# Patient Record
Sex: Female | Born: 1965 | Race: Asian | Hispanic: No | Marital: Married | Smoking: Never smoker
Health system: Southern US, Community
[De-identification: ages and names within clinical notes are randomized; demographics above are authoritative.]

## PROBLEM LIST (undated history)

## (undated) DIAGNOSIS — E039 Hypothyroidism, unspecified: Secondary | ICD-10-CM

## (undated) DIAGNOSIS — E079 Disorder of thyroid, unspecified: Secondary | ICD-10-CM

## (undated) DIAGNOSIS — Z8632 Personal history of gestational diabetes: Secondary | ICD-10-CM

## (undated) DIAGNOSIS — Z8719 Personal history of other diseases of the digestive system: Secondary | ICD-10-CM

## (undated) DIAGNOSIS — D649 Anemia, unspecified: Secondary | ICD-10-CM

## (undated) HISTORY — DX: Personal history of gestational diabetes: Z86.32

## (undated) HISTORY — DX: Personal history of other diseases of the digestive system: Z87.19

## (undated) HISTORY — PX: TUBAL LIGATION: SHX77

## (undated) HISTORY — PX: LAPAROTOMY: SHX154

## (undated) HISTORY — DX: Disorder of thyroid, unspecified: E07.9

---
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
## 1994-12-22 DIAGNOSIS — Z8632 Personal history of gestational diabetes: Secondary | ICD-10-CM

## 1994-12-22 HISTORY — DX: Personal history of gestational diabetes: Z86.32

## 2008-05-16 ENCOUNTER — Encounter: Admission: RE | Admit: 2008-05-16 | Discharge: 2008-05-16 | Payer: Self-pay | Admitting: Internal Medicine

## 2008-06-05 ENCOUNTER — Encounter: Admission: RE | Admit: 2008-06-05 | Discharge: 2008-06-05 | Payer: Self-pay | Admitting: Internal Medicine

## 2008-07-27 ENCOUNTER — Other Ambulatory Visit: Admission: RE | Admit: 2008-07-27 | Discharge: 2008-07-27 | Payer: Self-pay | Admitting: Obstetrics & Gynecology

## 2011-11-17 ENCOUNTER — Other Ambulatory Visit (HOSPITAL_COMMUNITY): Payer: Self-pay | Admitting: Internal Medicine

## 2011-11-17 DIAGNOSIS — E049 Nontoxic goiter, unspecified: Secondary | ICD-10-CM

## 2011-11-20 ENCOUNTER — Ambulatory Visit (HOSPITAL_COMMUNITY)
Admission: RE | Admit: 2011-11-20 | Discharge: 2011-11-20 | Disposition: A | Discharge status: Home / Self Care | Payer: Self-pay | Source: Ambulatory Visit | Attending: Internal Medicine | Admitting: Internal Medicine

## 2011-11-20 DIAGNOSIS — E042 Nontoxic multinodular goiter: Secondary | ICD-10-CM | POA: Insufficient documentation

## 2011-11-20 DIAGNOSIS — E049 Nontoxic goiter, unspecified: Secondary | ICD-10-CM

## 2013-06-27 ENCOUNTER — Other Ambulatory Visit: Payer: Self-pay | Admitting: Internal Medicine

## 2013-06-27 DIAGNOSIS — E049 Nontoxic goiter, unspecified: Secondary | ICD-10-CM

## 2013-06-29 ENCOUNTER — Other Ambulatory Visit: Payer: Self-pay | Admitting: Internal Medicine

## 2013-06-29 ENCOUNTER — Other Ambulatory Visit: Payer: Self-pay

## 2013-06-29 ENCOUNTER — Ambulatory Visit
Admission: RE | Admit: 2013-06-29 | Discharge: 2013-06-29 | Disposition: A | Discharge status: Home / Self Care | Payer: BC Managed Care – PPO | Source: Ambulatory Visit | Attending: Internal Medicine | Admitting: Internal Medicine

## 2013-06-29 DIAGNOSIS — E049 Nontoxic goiter, unspecified: Secondary | ICD-10-CM

## 2013-06-30 ENCOUNTER — Other Ambulatory Visit: Payer: Self-pay | Admitting: Internal Medicine

## 2013-06-30 DIAGNOSIS — E041 Nontoxic single thyroid nodule: Secondary | ICD-10-CM

## 2013-07-06 ENCOUNTER — Ambulatory Visit
Admission: RE | Admit: 2013-07-06 | Discharge: 2013-07-06 | Disposition: A | Discharge status: Home / Self Care | Payer: BC Managed Care – PPO | Source: Ambulatory Visit | Attending: Internal Medicine | Admitting: Internal Medicine

## 2013-07-06 ENCOUNTER — Other Ambulatory Visit (HOSPITAL_COMMUNITY)
Admission: RE | Admit: 2013-07-06 | Discharge: 2013-07-06 | Disposition: A | Discharge status: Home / Self Care | Payer: BC Managed Care – PPO | Source: Ambulatory Visit | Attending: Interventional Radiology | Admitting: Interventional Radiology

## 2013-07-06 DIAGNOSIS — E041 Nontoxic single thyroid nodule: Secondary | ICD-10-CM

## 2013-07-06 DIAGNOSIS — D449 Neoplasm of uncertain behavior of unspecified endocrine gland: Secondary | ICD-10-CM | POA: Insufficient documentation

## 2013-11-21 ENCOUNTER — Ambulatory Visit (INDEPENDENT_AMBULATORY_CARE_PROVIDER_SITE_OTHER): Payer: BC Managed Care – PPO | Admitting: General Surgery

## 2013-11-21 ENCOUNTER — Encounter (INDEPENDENT_AMBULATORY_CARE_PROVIDER_SITE_OTHER): Payer: Self-pay | Admitting: General Surgery

## 2013-11-21 ENCOUNTER — Encounter (INDEPENDENT_AMBULATORY_CARE_PROVIDER_SITE_OTHER): Payer: Self-pay

## 2013-11-21 VITALS — BP 100/68 | HR 71 | Temp 98.5°F | Resp 16 | Ht 67.0 in | Wt 143.0 lb

## 2013-11-21 DIAGNOSIS — E041 Nontoxic single thyroid nodule: Secondary | ICD-10-CM

## 2013-11-21 DIAGNOSIS — E042 Nontoxic multinodular goiter: Secondary | ICD-10-CM | POA: Insufficient documentation

## 2013-11-21 NOTE — Progress Notes (Signed)
Patient ID: Kristen Mendez, female   DOB: [birth year], 47 y.o.   MRN: [id number]  Chief Complaint  Patient presents with  . Thyroid Nodule    HPI Kristen Mendez is a 47 y.o. female.  She is referred by Dr. Evette Doffing for evaluation of mild goiter and multiple thyroid nodules.  The patient states that her thyroid gland felt big in 1996. More recently she feels tight in the neck. It feels tight when she swallows but the food goes right on down. She says her voice is "tired" at the end of the day, but no real hoarseness. The thyroid has never been painful or tender to touch.  Ultrasound on 06/29/2013 shows  enlarged bilateral thyroid lobes, heterogeneous, hypervascular. There is a 23 mm nodule on the left side that has been enlarging. There are 2 other nodules in the left side. On the right side there is a subcentimeter nodule. Fine needle aspiration cytology of the largest nodule on the left shows lymphocytes and Hurthle cells and is felt to be consistent with thyroiditis. This was signed out as a follicular lesion of uncertain significance.  TSH level on 06/21/2013 was 2.6 which is normal. She was thereafter placed on Synthroid but there was no change in her thyroid gland she did not suppress. Her TSH level on 10/28/2039 was 2.04.She was found to have iron deficiency anemia with a hemoglobin of 9.3 in July of 10.4 in November. She is been placed on iron.  Dr. Ricki Miller discussed this with Dr. Juleen China (endocrinology)  who suggested that with the size she should consider removal.  Family history is negative for thyroid cancer, multiple endocrine neoplasias.  Patient's past history is negative for radiation therapy. She a laparotomy for "pancreatitis" in [place]. She states that nothing was removed and this was a diagnostic procedure.  HPI  Past Medical History  Diagnosis Date  . Thyroid disease   . Hx of acute pancreatitis 1981  . Hx gestational diabetes 1996    Past Surgical History  Procedure  Laterality Date  . Cesarean section  1996    Family History  Problem Relation Age of Onset  . Diabetes Father     Social History History  Substance Use Topics  . Smoking status: Never Smoker   . Smokeless tobacco: Not on file  . Alcohol Use: No    No Known Allergies  Current Outpatient Prescriptions  Medication Sig Dispense Refill  . levothyroxine (SYNTHROID, LEVOTHROID) 75 MCG tablet Take 75 mcg by mouth daily before breakfast.      . Multiple Vitamin (MULTIVITAMIN) tablet Take 1 tablet by mouth daily.       No current facility-administered medications for this visit.    Review of Systems Review of Systems  Constitutional: Negative for fever, chills and unexpected weight change.  HENT: Positive for trouble swallowing and voice change. Negative for congestion, hearing loss and sore throat.   Eyes: Negative for visual disturbance.  Respiratory: Negative for cough and wheezing.   Cardiovascular: Negative for chest pain, palpitations and leg swelling.  Gastrointestinal: Negative for nausea, vomiting, abdominal pain, diarrhea, constipation, blood in stool, abdominal distention and anal bleeding.  Genitourinary: Negative for hematuria, vaginal bleeding and difficulty urinating.  Musculoskeletal: Negative for arthralgias.  Skin: Negative for rash and wound.  Neurological: Negative for seizures, syncope and headaches.  Hematological: Negative for adenopathy. Does not bruise/bleed easily.  Psychiatric/Behavioral: Negative for confusion. The patient is nervous/anxious.     Blood pressure 100/68, pulse 71, temperature 98.5  F (36.9 C), temperature source Temporal, resp. rate 16, height 5\' 7"  (1.702 m), weight 143 lb (64.864 kg).  Physical Exam Physical Exam  Constitutional: She is oriented to person, place, and time. She appears well-developed and well-nourished. She appears distressed.  She has mild anxiety and is a little bit distressed in making decisions.  HENT:  Head:  Normocephalic and atraumatic.  Nose: Nose normal.  Mouth/Throat: No oropharyngeal exudate.  Eyes: Conjunctivae and EOM are normal. Pupils are equal, round, and reactive to light. Left eye exhibits no discharge. No scleral icterus.  Neck: Neck supple. No JVD present. No tracheal deviation present. Thyromegaly present.  Mildly prominent  bilateral thyroid lobes. Nontender. No discrete mass. No tracheal deviation. Voice is normal.  Cardiovascular: Normal rate, regular rhythm, normal heart sounds and intact distal pulses.   No murmur heard. Pulmonary/Chest: Effort normal and breath sounds normal. No respiratory distress. She has no wheezes. She has no rales. She exhibits no tenderness.  Abdominal: Soft. Bowel sounds are normal. She exhibits no distension and no mass. There is no tenderness. There is no rebound and no guarding.  Musculoskeletal: She exhibits no edema and no tenderness.  Lymphadenopathy:    She has no cervical adenopathy.  Neurological: She is alert and oriented to person, place, and time. She exhibits normal muscle tone. Coordination normal.  Skin: Skin is warm. No rash noted. She is not diaphoretic. No erythema. No pallor.  Psychiatric: She has a normal mood and affect. Her behavior is normal. Judgment and thought content normal.  Some anxiety and ambivalence about decision-making, but otherwise mental status is completely normal.    Data Reviewed Office notes. Imaging studies. Cytopathology report. 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  Hashimoto's thyroiditis is also a possibility. The patient was advised of this and that it may lead to hypothyroidism in the future.  Enlarging left thyroid nodule. Left thyroid lobectomy is offered to rule out neoplasia  Pressure symptoms are vague. Her goiter may or may not be causing her swallowing problems. She was advised that left thyroid lobectomy  may or may not improve this. No apparent indication for  total thyroidectomy based on workup to date.  Iron deficiency anemia  Remote history of exploratory laparotomy with findings of pancreatitis in [place].  Mild anxiety     Plan    I had a very long talk with the patient about the differential diagnosis. Indications for surgery. Long-term followup. Recommend extent of surgery. Expectations of surgery. I told her I was not sure that her swallowing symptoms would improve. I told her that the main indication for surgery was an enlarging nodule greater than 2 cm, and that left thyroid lobectomy was indicated to rule out neoplasia. She is aware that this is elective.  At the end of a very long conversation, she indicated that she would like to go ahead with left thyroid lobectomy. She is aware that if well-differentiated cancer is found that she may need a completion thyroidectomy at a later date.  She'll be scheduled for left thyroid lobectomy with overnight stay in the hospital.  I discussed the indications, details, techniques, and numerous risk of the surgery with her. She is aware of the risk of bleeding, infection, reoperation for cancer. She's aware of the risk of temporary or permanent hypocalcemia from parathyroid gland injury, although less likely with a lobectomy than a total thyroidectomy. She is aware of the possibility of temporary or permanent hoarseness from  recurrent laryngeal  nerve injury. She knows that she may or may not require thyroid hormone in the future. At this time all of her questions are answered and she seems to understand all these issues well. She agrees with this plan.        Angelia Mould. Derrell Lolling, M.D., [facility] General and Minimally invasive Surgery Breast and Colorectal Surgery Office:   [phone] Pager:   [phone]  11/21/2013, 5:33 PM

## 2013-11-21 NOTE — Patient Instructions (Signed)
You have enlarging nodules in your left thyroid lobe. Dr. Derrell Lolling advises a left thyroid lobectomy to prove that these are not low-grade cancers.  The tightness that you feel in your throat may or may not be due to your thyroid gland. We will have to wait and see.     Thyroidectomy Thyroidectomy is the removal of part or all of your thyroid gland. Your thyroid gland is a butterfly-shaped gland at the base of your neck. It produces a substance called thyroid hormone, which regulates the physical and chemical processes that keep your body functioning and make energy available to your body (metabolism). The amount of thyroid gland tissue that is removed during a thyroidectomy depends on the reason for the procedure. Typically, if only a part of your gland is removed, enough thyroid gland tissue remains to maintain normal function. If your entire thyroid gland is removed or if the amount of thyroid gland tissue remaining is inadequate to maintain normal function, you will need life-long treatment with thyroid hormone on a daily basis. Thyroidectomy maybe performed when you have the following conditions:  Thyroid nodules. These are small, abnormal collections of tissue that form inside the thyroid gland. If these nodules begin to enlarge at a rapid rate, a sample of tissue from the nodule is taken through a needle and examined (needle biopsy). This is done to determine if the nodules are cancerous. Depending on the outcome of this exam, thyroidectomy may be necessary.  Thyroid cancer.  Goiter, which is an enlarged thyroid gland. All or part of the thyroid gland may be removed if the gland has become so large that it causes difficulty breathing or swallowing.  Hyperthyroidism. This is when the thyroid gland produces too much thyroid hormone. Hypothyroidism can cause symptoms of fluctuating weight, intolerance to heat, irritability, shortness of breath, and chest pain. LET YOUR CAREGIVER KNOW ABOUT:    Allergies to food or medicine.  Medicines that you are taking, including vitamins, herbs, eyedrops, over-the-counter medicines, and creams.  Previous problems you have had with anesthetics or numbing medicines.  History of bleeding problems or blood clots.  Previous surgeries you have had.  Other health problems, including diabetes and kidney problems, you have had.  Possibility of pregnancy, if this applies. BEFORE THE PROCEDURE   Do not eat or drink anything, including water, for at least 6 hours before the procedure.  Ask your caregiver whether you should stop taking certain medicines before the day of the procedure. PROCEDURE  There are different ways that thyroidectomy is performed. For each type, you will be given a medicine to make you sleep (general anesthetic). The three main types of thyroidectomy are listed as follows:  Conventional thyroidectomy A cut (incision) in the center portion of your lower neck is made with a scalpel. Muscles below your skin are separated to gain access to your thyroid gland. Your thyroid gland is dissected from your windpipe (trachea). Often a drain is placed at the incision site to drain any blood that accumulates under the skin after the procedure. This drain will be removed before you go home. The wound from the incision should heal within 2 weeks.  Endoscopic thyroidectomy Small incisions are made in your lower neck. A small instrument (endoscope) is inserted under your skin at the incision sites. The endoscope used for thyroidectomy consists of 2 flexible tubes. Inside one of the tubes is a video camera that is used to guide the Careers adviser. Tools to remove the thyroid gland, including a tool  to cut the gland (dissectors) and a suction device, are inserted through the other tube. The surgeon uses the dissectors to dissect the thyroid gland from the trachea and remove it.  Robotic thyroidectomy This procedure allows your thyroid gland to be removed  through incisions in your armpit, your chest, or high in your neck. Instruments similar to endoscopes provide a 3-dimensional picture of the surgical site. Dissecting instruments are controlled by devices similar to joysticks. These devices allow more accurate manipulation of the instruments. After the blood supply to the gland is removed, the gland is cut into several pieces and removed through the incisions. RISKS AND COMPLICATIONS Complications associated with thyroidectomy are rare, but they can occur. Possible complications include:  A decrease in parathyroid hormone levels (hypoparathyroidism) Your parathyroid glands are located close behind your thyroid gland. They are responsible for maintaining calcium levels inthe body. If they are damaged or removed, levels of calcium in the blood become low and nerves become irritable, which can cause muscle spasms. Medicines are available to treat this.  Bacterial infection This can often be treated with medicines that kill bacteria (antibiotics).  Damage to your voice box nerves This could cause hoarseness or complete loss of voice.  Bleeding or airway obstruction. AFTER THE PROCEDURE   You will rest in the recovery room as you wake up.  When you first wake up, your throat may feel slightly sore.  You will not be allowed to eat or drink until instructed otherwise.  You will be taken to your hospital room. You will usually stay at the hospital for 1 or 2 nights.  If a drain is placed during the procedure, it usually is removed the next day.  You may have some mild neck pain.  Your voice may be weak. This usually is temporary. Document Released: 06/03/2001 Document Revised: 04/04/2013 Document Reviewed: 03/12/2011 [facility] Patient Information 2014 [place], [place].

## 2013-12-22 HISTORY — PX: OTHER SURGICAL HISTORY: SHX169

## 2013-12-27 ENCOUNTER — Other Ambulatory Visit: Payer: Self-pay | Admitting: Internal Medicine

## 2013-12-27 DIAGNOSIS — E079 Disorder of thyroid, unspecified: Secondary | ICD-10-CM

## 2014-02-08 ENCOUNTER — Other Ambulatory Visit: Payer: BC Managed Care – PPO

## 2014-04-23 IMAGING — US US THYROID BIOPSY
1 series · 13 of 15 positions shown · non-contrast
Comparison: none

INDICATION: Indeterminate left-sided thyroid nodule

[Series 1: us thyroid biopsy · 0.06mm/px · 15 acquisitions, 13 frames shown]
[im 1/15]
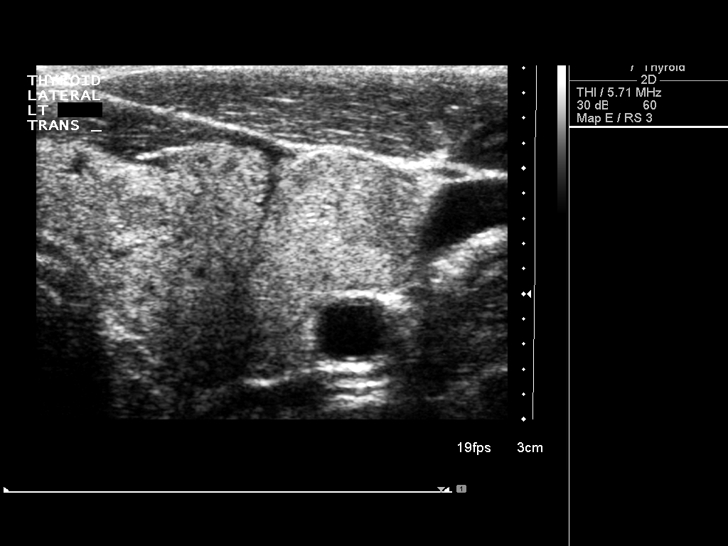
[im 2/15]
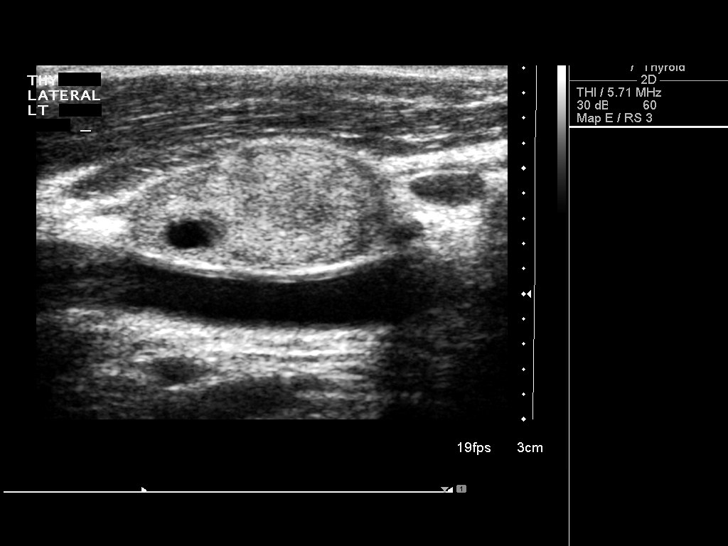
[im 3/15]
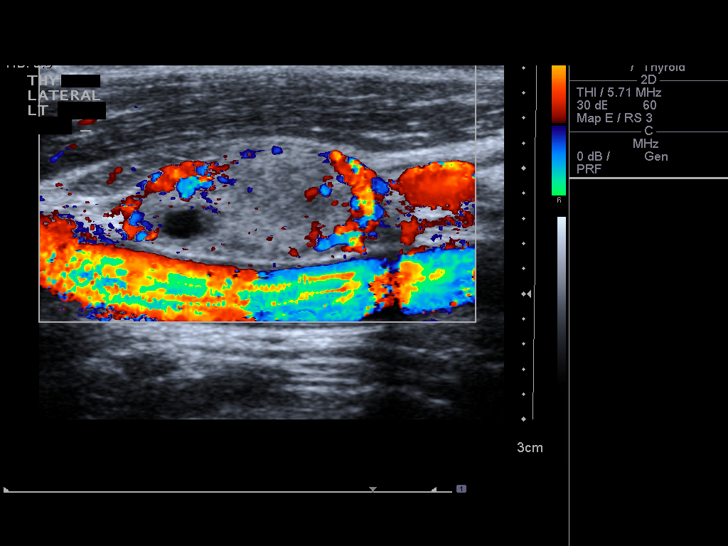
[im 5/15]
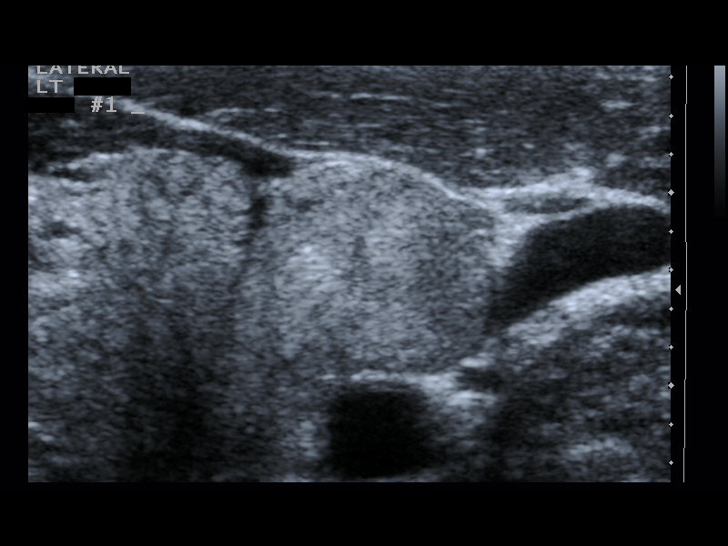
[im 6/15]
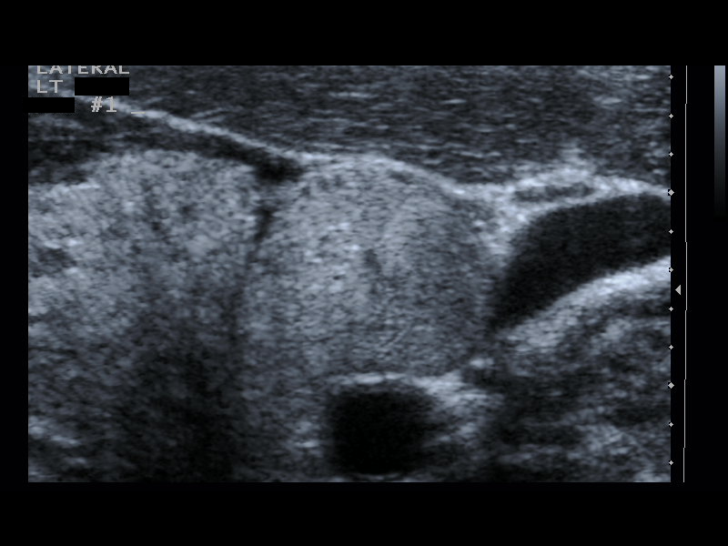
[im 7/15]
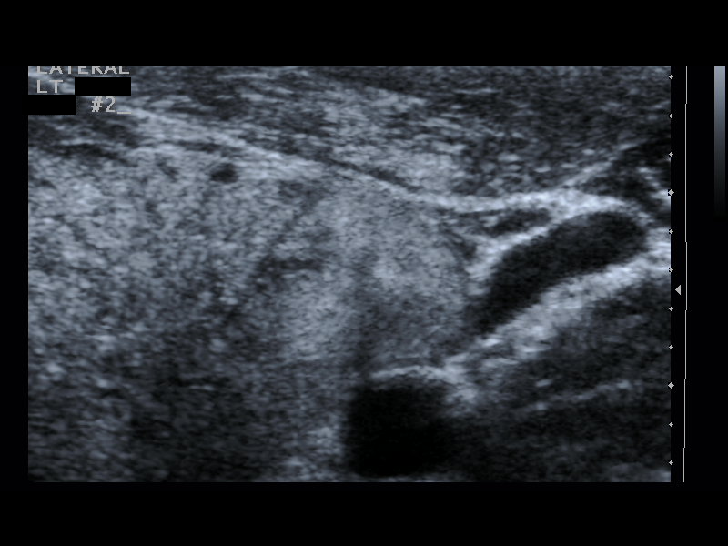
[im 8/15]
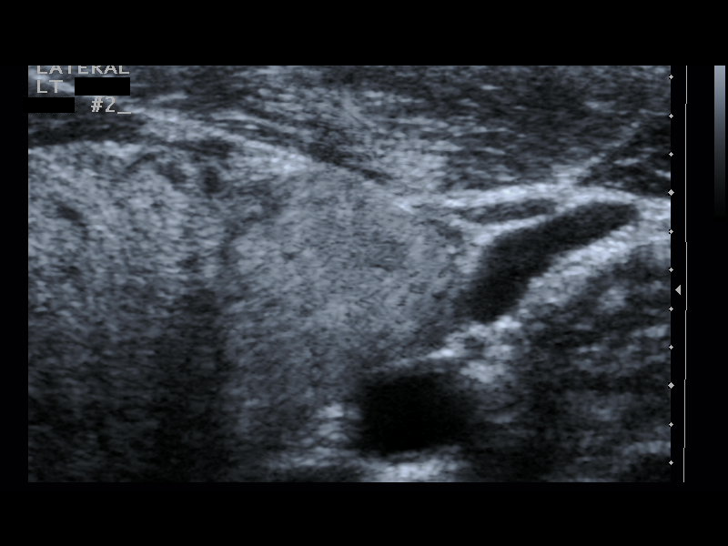
[im 9/15]
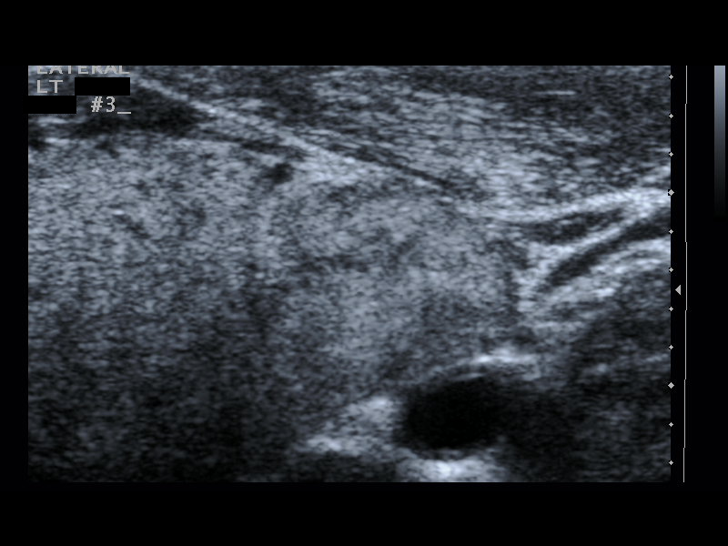
[im 10/15]
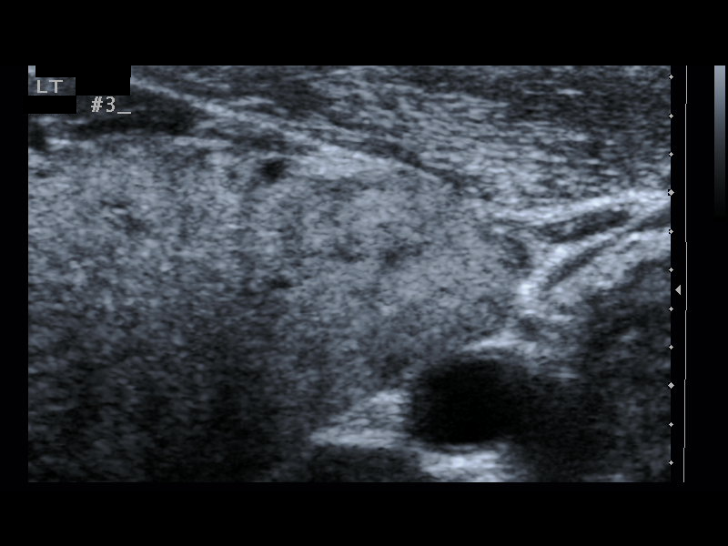
[im 11/15]
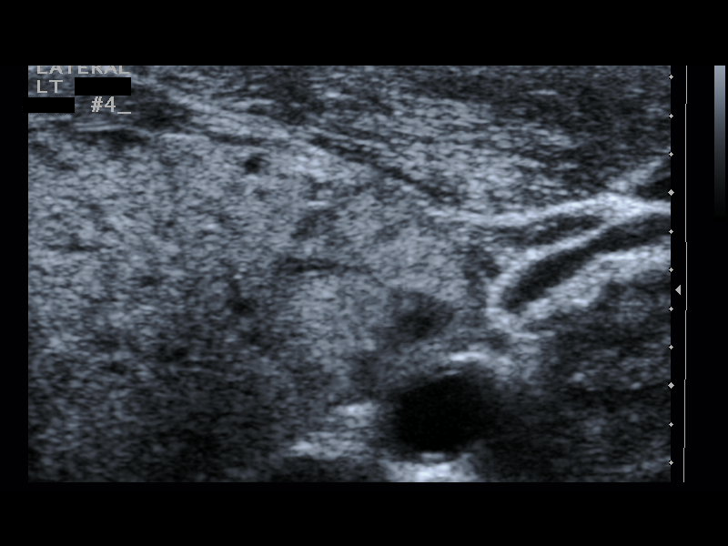
[im 13/15]
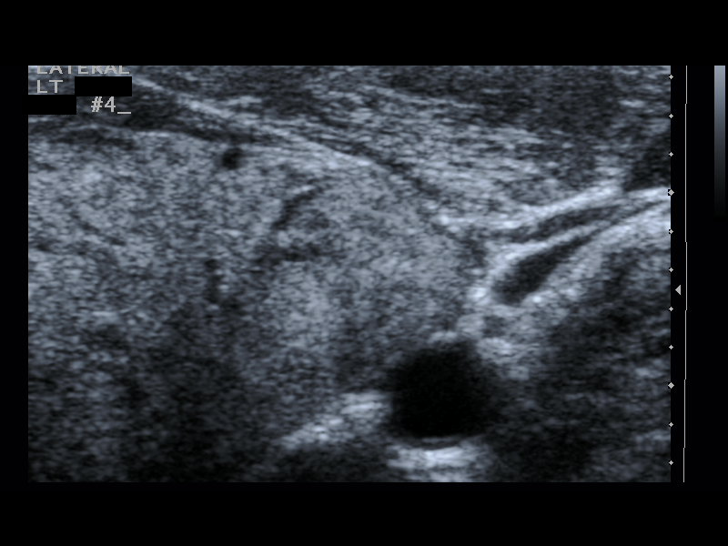
[im 14/15]
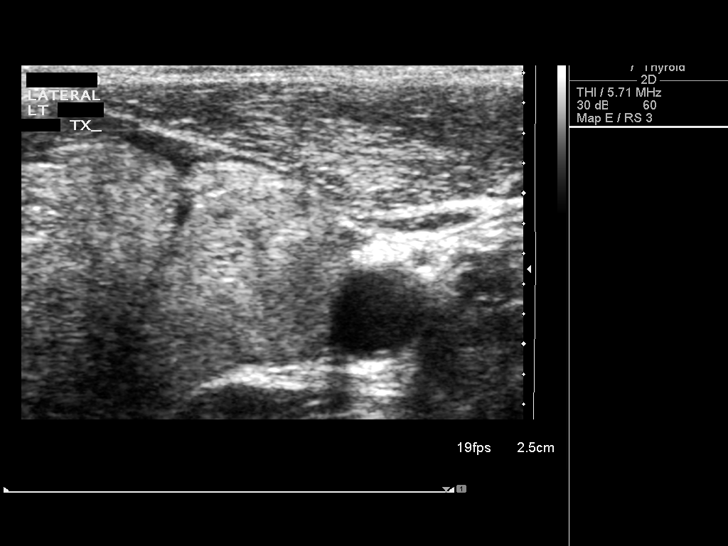
[im 15/15]
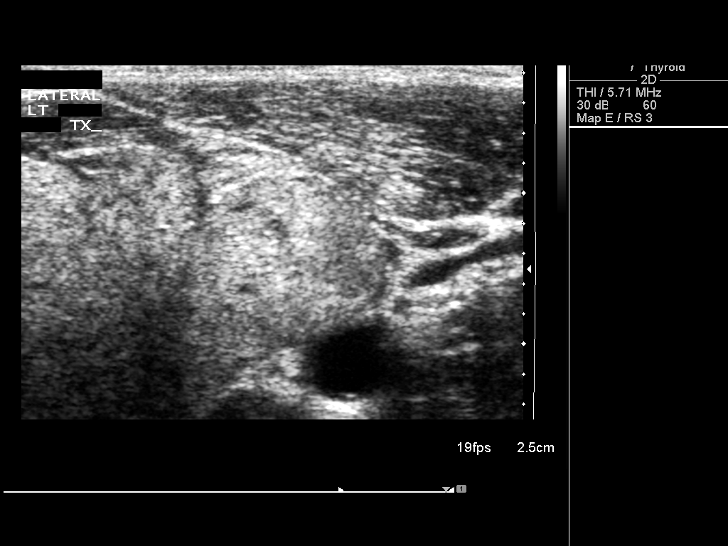

[13 of 15 positions shown; findings below may reference images not displayed]

ULTRASOUND GUIDED THYROID FINE NEEDLE ASPIRATION

Comparisons: Thyroid Ultrasound - 06/29/2013; 11/20/2011

Intravenous Medications: None

Complications: None immediate

Technique / Findings:

Informed written consent was obtained from the patient after a
discussion of the risks, benefits and alternatives to treatment.
Questions regarding the procedure were encouraged and answered.  A
timeout was performed prior to the initiation of the procedure.

Pre-procedural ultrasound scanning demonstrated grossly unchanged
appearance of mixed echogenic solid nodule within the lateral
inferior aspect of the left lobe of the thyroid..  The procedure
was planned.  The neck was prepped in the usual sterile fashion,
and a sterile drape was applied covering the operative field.  A
timeout was performed prior to the initiation of the procedure.
Local anesthesia was provided with 1% lidocaine.

Under direct ultrasound guidance, 4 FNA biopsies were performed of
nodule with a 25 gauge needle.  The samples were prepared and
submitted to pathology.  Limited post procedural scanning was
negative for hematoma or additional complication.  A dressing was
placed.  The patient tolerated procedure well without immediate
postprocedural complication.
IMPRESSION: Technically successful ultrasound guided fine needle aspiration of
dominant solid nodule within the inferior lateral aspect of the
left lobe of the thyroid.

## 2014-06-12 ENCOUNTER — Other Ambulatory Visit (INDEPENDENT_AMBULATORY_CARE_PROVIDER_SITE_OTHER): Payer: Self-pay | Admitting: Otolaryngology

## 2014-06-12 DIAGNOSIS — R198 Other specified symptoms and signs involving the digestive system and abdomen: Secondary | ICD-10-CM

## 2014-06-12 DIAGNOSIS — R0989 Other specified symptoms and signs involving the circulatory and respiratory systems: Secondary | ICD-10-CM

## 2014-06-26 ENCOUNTER — Ambulatory Visit
Admission: RE | Admit: 2014-06-26 | Discharge: 2014-06-26 | Disposition: A | Discharge status: Home / Self Care | Payer: BC Managed Care – PPO | Source: Ambulatory Visit | Attending: Otolaryngology | Admitting: Otolaryngology

## 2014-06-26 DIAGNOSIS — R198 Other specified symptoms and signs involving the digestive system and abdomen: Secondary | ICD-10-CM

## 2014-06-26 DIAGNOSIS — R0989 Other specified symptoms and signs involving the circulatory and respiratory systems: Secondary | ICD-10-CM

## 2014-06-26 MED ORDER — IOHEXOL 300 MG/ML  SOLN
75.0000 mL | Freq: Once | INTRAMUSCULAR | Status: AC | PRN
  Administered 2014-06-26: 75 mL via INTRAVENOUS

## 2015-01-03 ENCOUNTER — Telehealth: Payer: Self-pay | Admitting: Obstetrics and Gynecology

## 2015-01-03 ENCOUNTER — Encounter: Payer: Self-pay | Admitting: Obstetrics and Gynecology

## 2015-01-03 NOTE — Telephone Encounter (Signed)
.   OPEN BY ERROR

## 2015-01-10 ENCOUNTER — Other Ambulatory Visit: Payer: Self-pay | Admitting: Obstetrics & Gynecology

## 2015-01-11 LAB — CYTOLOGY - PAP

## 2015-06-11 ENCOUNTER — Other Ambulatory Visit: Payer: Self-pay | Admitting: Obstetrics & Gynecology

## 2015-06-18 ENCOUNTER — Other Ambulatory Visit: Payer: Self-pay | Admitting: Obstetrics & Gynecology

## 2015-06-19 NOTE — Patient Instructions (Addendum)
   Your procedure is scheduled on:  Monday, July 11  Enter through the Main Entrance of [facility] at: 10:45 AM Pick up the phone at the desk and dial [phone] and inform us of your arrival.  Please call this number if you have any problems the morning of surgery: [phone]  Remember: Do not eat food after midnight: Sunday Do not drink clear liquids after: 8 AM Monday, day of surgery Take these medicines the morning of surgery with a SIP OF WATER:  synthroid  Do not wear jewelry, make-up, or FINGER nail polish No metal in your hair or on your body. Do not wear lotions, powders, perfumes.  You may wear deodorant.  Do not bring valuables to the hospital. Contacts may not be worn into surgery.  Leave suitcase in the car. After Surgery it may be brought to your room. For patients being admitted to the hospital, checkout time is 11:00am the day of discharge.  Home with Kristen Mendez cell [phone]

## 2015-06-20 ENCOUNTER — Encounter (HOSPITAL_COMMUNITY)
Admission: RE | Admit: 2015-06-20 | Discharge: 2015-06-20 | Disposition: A | Discharge status: Home / Self Care | Payer: 59 | Source: Ambulatory Visit | Attending: Obstetrics & Gynecology | Admitting: Obstetrics & Gynecology

## 2015-06-20 ENCOUNTER — Encounter (HOSPITAL_COMMUNITY): Payer: Self-pay

## 2015-06-20 DIAGNOSIS — Z01818 Encounter for other preprocedural examination: Secondary | ICD-10-CM | POA: Insufficient documentation

## 2015-06-20 DIAGNOSIS — D259 Leiomyoma of uterus, unspecified: Secondary | ICD-10-CM | POA: Insufficient documentation

## 2015-06-20 DIAGNOSIS — N920 Excessive and frequent menstruation with regular cycle: Secondary | ICD-10-CM | POA: Diagnosis not present

## 2015-06-20 HISTORY — DX: Hypothyroidism, unspecified: E03.9

## 2015-06-20 HISTORY — DX: Anemia, unspecified: D64.9

## 2015-06-20 LAB — CBC
HCT: 35 % — ABNORMAL LOW (ref 36.0–46.0)
Hemoglobin: 11.4 g/dL — ABNORMAL LOW (ref 12.0–15.0)
MCH: 26.5 pg (ref 26.0–34.0)
MCHC: 32.6 g/dL (ref 30.0–36.0)
MCV: 81.2 fL (ref 78.0–100.0)
PLATELETS: 195 10*3/uL (ref 150–400)
RBC: 4.31 MIL/uL (ref 3.87–5.11)
RDW: 16.7 % — ABNORMAL HIGH (ref 11.5–15.5)
WBC: 4.9 10*3/uL (ref 4.0–10.5)

## 2015-06-20 LAB — TYPE AND SCREEN
ABO/RH(D): B POS
Antibody Screen: NEGATIVE

## 2015-06-21 LAB — ABO/RH: ABO/RH(D): B POS

## 2015-07-02 ENCOUNTER — Observation Stay (HOSPITAL_COMMUNITY)
Admission: RE | Admit: 2015-07-02 | Discharge: 2015-07-03 | Disposition: A | Discharge status: Home / Self Care | Payer: 59 | Source: Ambulatory Visit | Attending: Obstetrics & Gynecology | Admitting: Obstetrics & Gynecology

## 2015-07-02 ENCOUNTER — Encounter (HOSPITAL_COMMUNITY)
Admission: RE | Disposition: A | Discharge status: Home / Self Care | Payer: Self-pay | Source: Ambulatory Visit | Attending: Obstetrics & Gynecology

## 2015-07-02 ENCOUNTER — Ambulatory Visit (HOSPITAL_COMMUNITY): Payer: 59 | Admitting: Anesthesiology

## 2015-07-02 ENCOUNTER — Encounter (HOSPITAL_COMMUNITY): Payer: Self-pay

## 2015-07-02 DIAGNOSIS — Z90710 Acquired absence of both cervix and uterus: Secondary | ICD-10-CM | POA: Diagnosis present

## 2015-07-02 DIAGNOSIS — Z79899 Other long term (current) drug therapy: Secondary | ICD-10-CM | POA: Insufficient documentation

## 2015-07-02 DIAGNOSIS — N939 Abnormal uterine and vaginal bleeding, unspecified: Secondary | ICD-10-CM | POA: Diagnosis present

## 2015-07-02 DIAGNOSIS — N830 Follicular cyst of ovary: Secondary | ICD-10-CM | POA: Insufficient documentation

## 2015-07-02 DIAGNOSIS — D251 Intramural leiomyoma of uterus: Principal | ICD-10-CM | POA: Insufficient documentation

## 2015-07-02 DIAGNOSIS — N831 Corpus luteum cyst: Secondary | ICD-10-CM | POA: Diagnosis not present

## 2015-07-02 DIAGNOSIS — E039 Hypothyroidism, unspecified: Secondary | ICD-10-CM | POA: Diagnosis not present

## 2015-07-02 DIAGNOSIS — D649 Anemia, unspecified: Secondary | ICD-10-CM | POA: Diagnosis not present

## 2015-07-02 DIAGNOSIS — N946 Dysmenorrhea, unspecified: Secondary | ICD-10-CM | POA: Insufficient documentation

## 2015-07-02 HISTORY — PX: CYSTOSCOPY: SHX5120

## 2015-07-02 HISTORY — PX: LAPAROSCOPIC ASSISTED VAGINAL HYSTERECTOMY: SHX5398

## 2015-07-02 HISTORY — PX: LAPAROSCOPIC BILATERAL SALPINGO OOPHERECTOMY: SHX5890

## 2015-07-02 HISTORY — DX: Acquired absence of both cervix and uterus: Z90.710

## 2015-07-02 LAB — PREGNANCY, URINE: Preg Test, Ur: NEGATIVE

## 2015-07-02 LAB — TYPE AND SCREEN
ABO/RH(D): B POS
Antibody Screen: NEGATIVE

## 2015-07-02 SURGERY — HYSTERECTOMY, VAGINAL, LAPAROSCOPY-ASSISTED
Anesthesia: General

## 2015-07-02 MED ORDER — MENTHOL 3 MG MT LOZG: 1.0000 | LOZENGE | OROMUCOSAL | Status: DC | PRN

## 2015-07-02 MED ORDER — IBUPROFEN 800 MG PO TABS: 800.0000 mg | ORAL_TABLET | Freq: Three times a day (TID) | ORAL | Status: DC | PRN

## 2015-07-02 MED ORDER — HYDROMORPHONE HCL 1 MG/ML IJ SOLN
INTRAMUSCULAR | Status: AC
  Filled 2015-07-02: qty 1

## 2015-07-02 MED ORDER — LIDOCAINE HCL (CARDIAC) 20 MG/ML IV SOLN
INTRAVENOUS | Status: AC
  Filled 2015-07-02: qty 5

## 2015-07-02 MED ORDER — DEXAMETHASONE SODIUM PHOSPHATE 4 MG/ML IJ SOLN
INTRAMUSCULAR | Status: AC
  Filled 2015-07-02: qty 1

## 2015-07-02 MED ORDER — SCOPOLAMINE 1 MG/3DAYS TD PT72
1.0000 | MEDICATED_PATCH | Freq: Once | TRANSDERMAL | Status: DC
  Administered 2015-07-02: 1.5 mg via TRANSDERMAL

## 2015-07-02 MED ORDER — ACETAMINOPHEN 160 MG/5ML PO SOLN
975.0000 mg | Freq: Once | ORAL | Status: AC
  Administered 2015-07-02: 975 mg via ORAL

## 2015-07-02 MED ORDER — ROCURONIUM BROMIDE 100 MG/10ML IV SOLN
INTRAVENOUS | Status: AC
  Filled 2015-07-02: qty 1

## 2015-07-02 MED ORDER — PROPOFOL 10 MG/ML IV BOLUS
INTRAVENOUS | Status: DC | PRN
  Administered 2015-07-02: 180 mg via INTRAVENOUS

## 2015-07-02 MED ORDER — MIDAZOLAM HCL 2 MG/2ML IJ SOLN
INTRAMUSCULAR | Status: AC
  Filled 2015-07-02: qty 2

## 2015-07-02 MED ORDER — BUPIVACAINE HCL (PF) 0.25 % IJ SOLN
INTRAMUSCULAR | Status: AC
  Filled 2015-07-02: qty 30

## 2015-07-02 MED ORDER — GLYCOPYRROLATE 0.2 MG/ML IJ SOLN
INTRAMUSCULAR | Status: DC | PRN
  Administered 2015-07-02: 0.1 mg via INTRAVENOUS
  Administered 2015-07-02: 0.6 mg via INTRAVENOUS

## 2015-07-02 MED ORDER — ONDANSETRON HCL 4 MG PO TABS: 4.0000 mg | ORAL_TABLET | Freq: Four times a day (QID) | ORAL | Status: DC | PRN

## 2015-07-02 MED ORDER — ZOLPIDEM TARTRATE 5 MG PO TABS: 5.0000 mg | ORAL_TABLET | Freq: Every evening | ORAL | Status: DC | PRN

## 2015-07-02 MED ORDER — SCOPOLAMINE 1 MG/3DAYS TD PT72
MEDICATED_PATCH | TRANSDERMAL | Status: DC
Start: 2015-07-02 — End: 2015-07-03
  Administered 2015-07-02: 1.5 mg via TRANSDERMAL
  Filled 2015-07-02: qty 1

## 2015-07-02 MED ORDER — SIMETHICONE 80 MG PO CHEW
80.0000 mg | CHEWABLE_TABLET | Freq: Four times a day (QID) | ORAL | Status: DC
  Administered 2015-07-02: 80 mg via ORAL
  Filled 2015-07-02 (×2): qty 1

## 2015-07-02 MED ORDER — FENTANYL CITRATE (PF) 250 MCG/5ML IJ SOLN
INTRAMUSCULAR | Status: AC
  Filled 2015-07-02: qty 5

## 2015-07-02 MED ORDER — ONDANSETRON HCL 4 MG/2ML IJ SOLN
INTRAMUSCULAR | Status: AC
  Filled 2015-07-02: qty 2

## 2015-07-02 MED ORDER — LACTATED RINGERS IR SOLN
Status: DC | PRN
  Administered 2015-07-02: 3000 mL

## 2015-07-02 MED ORDER — NEOSTIGMINE METHYLSULFATE 10 MG/10ML IV SOLN
INTRAVENOUS | Status: DC | PRN
  Administered 2015-07-02: 3 mg via INTRAVENOUS

## 2015-07-02 MED ORDER — DEXAMETHASONE SODIUM PHOSPHATE 10 MG/ML IJ SOLN
INTRAMUSCULAR | Status: DC | PRN
  Administered 2015-07-02: 4 mg via INTRAVENOUS

## 2015-07-02 MED ORDER — MIDAZOLAM HCL 2 MG/2ML IJ SOLN
INTRAMUSCULAR | Status: DC | PRN
  Administered 2015-07-02: 2 mg via INTRAVENOUS

## 2015-07-02 MED ORDER — LACTATED RINGERS IV SOLN
INTRAVENOUS | Status: DC
  Administered 2015-07-02 (×3): via INTRAVENOUS

## 2015-07-02 MED ORDER — KETOROLAC TROMETHAMINE 30 MG/ML IJ SOLN: 30.0000 mg | Freq: Four times a day (QID) | INTRAMUSCULAR | Status: DC

## 2015-07-02 MED ORDER — HYDROMORPHONE HCL 1 MG/ML IJ SOLN
0.2500 mg | INTRAMUSCULAR | Status: DC | PRN
  Administered 2015-07-02: 0.5 mg via INTRAVENOUS

## 2015-07-02 MED ORDER — PROPOFOL 10 MG/ML IV BOLUS
INTRAVENOUS | Status: AC
  Filled 2015-07-02: qty 20

## 2015-07-02 MED ORDER — KETOROLAC TROMETHAMINE 30 MG/ML IJ SOLN
INTRAMUSCULAR | Status: AC
  Filled 2015-07-02: qty 1

## 2015-07-02 MED ORDER — LACTATED RINGERS IV SOLN
INTRAVENOUS | Status: DC
  Administered 2015-07-02 – 2015-07-03 (×2): via INTRAVENOUS

## 2015-07-02 MED ORDER — ONDANSETRON HCL 4 MG/2ML IJ SOLN
INTRAMUSCULAR | Status: DC | PRN
  Administered 2015-07-02: 4 mg via INTRAVENOUS

## 2015-07-02 MED ORDER — NEOSTIGMINE METHYLSULFATE 10 MG/10ML IV SOLN
INTRAVENOUS | Status: AC
  Filled 2015-07-02: qty 1

## 2015-07-02 MED ORDER — LIDOCAINE-EPINEPHRINE 1 %-1:100000 IJ SOLN
INTRAMUSCULAR | Status: DC | PRN
  Administered 2015-07-02: 20 mL

## 2015-07-02 MED ORDER — ROCURONIUM BROMIDE 100 MG/10ML IV SOLN
INTRAVENOUS | Status: DC | PRN
  Administered 2015-07-02: 40 mg via INTRAVENOUS
  Administered 2015-07-02: 10 mg via INTRAVENOUS

## 2015-07-02 MED ORDER — BUPIVACAINE HCL (PF) 0.25 % IJ SOLN
INTRAMUSCULAR | Status: DC | PRN
  Administered 2015-07-02: 10 mL
  Administered 2015-07-02: 5 mL

## 2015-07-02 MED ORDER — EPHEDRINE SULFATE 50 MG/ML IJ SOLN
INTRAMUSCULAR | Status: DC | PRN
  Administered 2015-07-02 (×2): 10 mg via INTRAVENOUS

## 2015-07-02 MED ORDER — LIDOCAINE-EPINEPHRINE 1 %-1:100000 IJ SOLN
INTRAMUSCULAR | Status: AC
  Filled 2015-07-02: qty 1

## 2015-07-02 MED ORDER — OXYCODONE-ACETAMINOPHEN 5-325 MG PO TABS: 1.0000 | ORAL_TABLET | ORAL | Status: DC | PRN

## 2015-07-02 MED ORDER — STERILE WATER FOR IRRIGATION IR SOLN
Status: DC | PRN
  Administered 2015-07-02: 1000 mL via INTRAVESICAL

## 2015-07-02 MED ORDER — ONDANSETRON HCL 4 MG/2ML IJ SOLN: 4.0000 mg | Freq: Four times a day (QID) | INTRAMUSCULAR | Status: DC | PRN

## 2015-07-02 MED ORDER — GLYCOPYRROLATE 0.2 MG/ML IJ SOLN
INTRAMUSCULAR | Status: AC
  Filled 2015-07-02: qty 2

## 2015-07-02 MED ORDER — GLYCOPYRROLATE 0.2 MG/ML IJ SOLN
INTRAMUSCULAR | Status: AC
  Filled 2015-07-02: qty 1

## 2015-07-02 MED ORDER — LIDOCAINE HCL (CARDIAC) 20 MG/ML IV SOLN
INTRAVENOUS | Status: DC | PRN
  Administered 2015-07-02: 50 mg via INTRAVENOUS

## 2015-07-02 MED ORDER — ACETAMINOPHEN 160 MG/5ML PO SOLN
ORAL | Status: AC
  Administered 2015-07-02: 975 mg via ORAL
  Filled 2015-07-02: qty 40.6

## 2015-07-02 MED ORDER — HYDROMORPHONE HCL 1 MG/ML IJ SOLN
0.2000 mg | INTRAMUSCULAR | Status: DC | PRN
  Administered 2015-07-02: 0.6 mg via INTRAVENOUS
  Filled 2015-07-02: qty 1

## 2015-07-02 MED ORDER — CEFAZOLIN SODIUM-DEXTROSE 2-3 GM-% IV SOLR
INTRAVENOUS | Status: AC
  Administered 2015-07-02: 2 g via INTRAVENOUS
  Filled 2015-07-02: qty 50

## 2015-07-02 MED ORDER — KETOROLAC TROMETHAMINE 30 MG/ML IJ SOLN
30.0000 mg | Freq: Four times a day (QID) | INTRAMUSCULAR | Status: DC
  Administered 2015-07-02 – 2015-07-03 (×3): 30 mg via INTRAVENOUS
  Filled 2015-07-02 (×3): qty 1

## 2015-07-02 MED ORDER — FENTANYL CITRATE (PF) 100 MCG/2ML IJ SOLN
INTRAMUSCULAR | Status: DC | PRN
  Administered 2015-07-02: 100 ug via INTRAVENOUS
  Administered 2015-07-02 (×2): 50 ug via INTRAVENOUS

## 2015-07-02 SURGICAL SUPPLY — 42 items
CLOTH BEACON ORANGE TIMEOUT ST (SAFETY) ×3 IMPLANT
COVER BACK TABLE 60X90IN (DRAPES) ×3 IMPLANT
COVER LIGHT HANDLE  1/PK (MISCELLANEOUS) ×1
COVER LIGHT HANDLE 1/PK (MISCELLANEOUS) ×2 IMPLANT
DECANTER SPIKE VIAL GLASS SM (MISCELLANEOUS) IMPLANT
DRSG COVADERM PLUS 2X2 (GAUZE/BANDAGES/DRESSINGS) ×6 IMPLANT
DRSG OPSITE POSTOP 3X4 (GAUZE/BANDAGES/DRESSINGS) ×3 IMPLANT
DURAPREP 26ML APPLICATOR (WOUND CARE) ×3 IMPLANT
ELECT REM PT RETURN 9FT ADLT (ELECTROSURGICAL) ×3
ELECTRODE REM PT RTRN 9FT ADLT (ELECTROSURGICAL) ×2 IMPLANT
EVACUATOR SMOKE 8.L (FILTER) ×3 IMPLANT
GLOVE BIO SURGEON STRL SZ 6.5 (GLOVE) ×6 IMPLANT
GLOVE BIO SURGEON STRL SZ7 (GLOVE) ×6 IMPLANT
LIGASURE 5MM LAPAROSCOPIC (INSTRUMENTS) ×1 IMPLANT
LIGASURE IMPACT 36 18CM CVD LR (INSTRUMENTS) IMPLANT
LIQUID BAND (GAUZE/BANDAGES/DRESSINGS) ×1 IMPLANT
MARKER SKIN DUAL TIP RULER LAB (MISCELLANEOUS) ×1 IMPLANT
NS IRRIG 1000ML POUR BTL (IV SOLUTION) ×3 IMPLANT
PACK LAVH (CUSTOM PROCEDURE TRAY) ×3 IMPLANT
PACK ROBOTIC GOWN (GOWN DISPOSABLE) ×3 IMPLANT
PAD POSITIONER PINK NONSTERILE (MISCELLANEOUS) ×3 IMPLANT
SCISSORS LAP 5X35 DISP (ENDOMECHANICALS) ×3 IMPLANT
SET CYSTO W/LG BORE CLAMP LF (SET/KITS/TRAYS/PACK) ×3 IMPLANT
SET IRRIG TUBING LAPAROSCOPIC (IRRIGATION / IRRIGATOR) ×1 IMPLANT
SLEEVE XCEL OPT CAN 5 100 (ENDOMECHANICALS) ×3 IMPLANT
SPONGE LAP 18X18 X RAY DECT (DISPOSABLE) ×1 IMPLANT
SUT MON AB 4-0 PS1 27 (SUTURE) ×3 IMPLANT
SUT VIC AB 0 CT1 27 (SUTURE) ×24
SUT VIC AB 0 CT1 27XBRD ANBCTR (SUTURE) ×4 IMPLANT
SUT VIC AB 0 CT1 27XCR 8 STRN (SUTURE) ×4 IMPLANT
SUT VIC AB 2-0 CT1 (SUTURE) ×6 IMPLANT
SUT VICRYL 0 TIES 12 18 (SUTURE) ×3 IMPLANT
SUT VICRYL 0 UR6 27IN ABS (SUTURE) ×6 IMPLANT
SYR 30ML LL (SYRINGE) ×1 IMPLANT
SYR BULB IRRIGATION 50ML (SYRINGE) ×3 IMPLANT
TOWEL OR 17X24 6PK STRL BLUE (TOWEL DISPOSABLE) ×6 IMPLANT
TRAY FOLEY CATH SILVER 14FR (SET/KITS/TRAYS/PACK) ×3 IMPLANT
TROCAR OPTI TIP 5M 100M (ENDOMECHANICALS) ×3 IMPLANT
TROCAR XCEL NON-BLD 11X100MML (ENDOMECHANICALS) ×3 IMPLANT
TROCAR XCEL OPT SLVE 5M 100M (ENDOMECHANICALS) ×1 IMPLANT
WARMER LAPAROSCOPE (MISCELLANEOUS) ×3 IMPLANT
WATER STERILE IRR 1000ML POUR (IV SOLUTION) ×3 IMPLANT

## 2015-07-02 NOTE — Anesthesia Procedure Notes (Signed)
Procedure Name: Intubation Date/Time: 07/02/2015 12:25 PM Performed by: Casimer Lanius A Pre-anesthesia Checklist: Patient identified, Emergency Drugs available, Suction available and Patient being monitored Patient Re-evaluated:Patient Re-evaluated prior to inductionOxygen Delivery Method: Circle system utilized and Simple face mask Preoxygenation: Pre-oxygenation with 100% oxygen Intubation Type: IV induction and Inhalational induction Ventilation: Mask ventilation without difficulty Laryngoscope Size: Mac and 3 Grade View: Grade II Tube type: Oral Tube size: 7.0 mm Number of attempts: 1 Airway Equipment and Method: Stylet Placement Confirmation: ETT inserted through vocal cords under direct vision,  positive ETCO2 and breath sounds checked- equal and bilateral Secured at: 20 (right lip) cm Tube secured with: Tape Dental Injury: Teeth and Oropharynx as per pre-operative assessment

## 2015-07-02 NOTE — Addendum Note (Signed)
Addendum  created 07/02/15 1727 by Hewitt Blade, CRNA   Modules edited: Notes Section   Notes Section:  File: [id number]

## 2015-07-02 NOTE — H&P (Signed)
Kristen Mendez is an 49 y.o. female with h/o severe dysmenorrhea and heavy menstrual bleeding.   Pertinent Gynecological History: Menses: flow is excessive with use of 3 pads or tampons on heaviest days Bleeding: dysfunctional uterine bleeding Contraception: tubal ligation DES exposure: denies Blood transfusions: none Sexually transmitted diseases: no past history Previous GYN Procedures: none  Last mammogram: normal 2016 Last pap: normal Date: 12/2014 OB History: G2, P2   Menstrual History: Menarche age: 51  Patient's last menstrual period was 06/16/2015.    Past Medical History  Diagnosis Date  . Thyroid disease   . Hx of acute pancreatitis 1981  . Hx gestational diabetes 1996  . SVD (spontaneous vaginal delivery)     x 1  . Anemia   . Hypothyroidism     Past Surgical History  Procedure Laterality Date  . Cesarean section  1996  . Left thyroid surgery  2015  . Laparotomy      pancreatitis  . Tubal ligation      Family History  Problem Relation Age of Onset  . Diabetes Father     Social History:  reports that she has never smoked. She has never used smokeless tobacco. She reports that she does not drink alcohol or use illicit drugs.  Allergies: No Known Allergies  Prescriptions prior to admission  Medication Sig Dispense Refill Last Dose  . ferrous sulfate 325 (65 FE) MG tablet Take 325 mg by mouth 2 (two) times a week.   Past Week at Unknown time  . levothyroxine (SYNTHROID, LEVOTHROID) 112 MCG tablet Take 112 mcg by mouth daily before breakfast.   07/02/2015 at 0700  . Multiple Vitamin (MULTIVITAMIN) tablet Take 1 tablet by mouth daily.   Past Week at Unknown time    Review of Systems  Constitutional: Negative for fever and chills.  HENT: Negative for hearing loss.   Eyes: Negative for blurred vision and double vision.  Respiratory: Negative for cough and hemoptysis.   Cardiovascular: Negative for chest pain and palpitations.  Gastrointestinal: Negative  for heartburn and nausea.  Genitourinary: Negative for dysuria and urgency.  Skin: Negative for rash.  Neurological: Negative for dizziness, tingling and headaches.  Psychiatric/Behavioral: Negative for depression and suicidal ideas.  All other systems reviewed and are negative.   Blood pressure 108/54, pulse 60, temperature 98.1 F (36.7 C), temperature source Oral, resp. rate 18, height 5\' 7"  (1.702 m), weight 66.679 kg (147 lb), last menstrual period 06/16/2015, SpO2 99 %. Physical Exam  Vitals reviewed. Constitutional: She is oriented to person, place, and time. She appears well-developed and well-nourished.  HENT:  Head: Normocephalic.  Eyes: Pupils are equal, round, and reactive to light.  Neck: Normal range of motion.  Cardiovascular: Normal rate and normal heart sounds.   Respiratory: Effort normal and breath sounds normal.  Genitourinary: Vagina normal.  Vulva: no masses, atrophy, or lesions. Vagina: no tenderness, erythema, cystocele, rectocele, abnormal vaginal discharge, or vesicle(s) or ulcers. Cervix: no discharge or cervical motion tenderness and grossly normal and sample taken for a Pap smear. Uterus: enlarged, fibroids, tender, and contour irregular and midline, mobile, and no uterine prolapse; Enlarged palpable posterior fibroid, tender at fundus on right side. Bladder/Urethra: no urethral discharge or mass and normal meatus, bladder non distended, and Urethra well supported. Adnexa/Parametria: no parametrial tenderness or mass and no adnexal tenderness or ovarian mass.   Musculoskeletal: Normal range of motion.  Neurological: She is alert and oriented to person, place, and time.    Results for orders  placed or performed during the hospital encounter of 07/02/15 (from the past 24 hour(s))  Type and screen     Status: None   Collection Time: 07/02/15 10:53 AM  Result Value Ref Range   ABO/RH(D) B POS    Antibody Screen NEG    Sample Expiration 07/05/2015   Pregnancy,  urine     Status: None   Collection Time: 07/02/15 10:53 AM  Result Value Ref Range   Preg Test, Ur NEGATIVE NEGATIVE    No results found.  Assessment/Plan: 49 yo with dysmenorrhea, pelvic pain and she reports abnormal uterine bleeding.  EMBX done preoperatively minimal Endometrial cells, was benign, but patient counseled that We can postpone today's hysterectomy and perform at [facility] instead to ensure there is no endometrial hyperplasia or endometrial cancer. She states that she wants to go ahead a proceed with scheduled r LAVH / BSO / Cystoscopy today. The R/B of surgery were discussed w/ the patient to include, but not limited to bleeding/transfusion, infection, wound breakdown/poor healing, damage to organs in abdomen and pelvis w/ possible need for further surgical repair, need for abdominal incision to complete procedure, blood clot, PE/MI/stroke. Additionally, risks/benefits of ovarian preservation were discussed to include 1/70 lifetime risk of ovarian cancer and 5-10% risk for need of future surgery for ovarian pathology. Pt understands these risks and consents to surgery. She desires removal of her ovaries. She understands that cancer may be diagnosed post operatively requiring further surgery and treatment.  Consent signed and put in chart  [place], [place] 07/02/2015, 12:05 PM

## 2015-07-02 NOTE — Transfer of Care (Signed)
Immediate Anesthesia Transfer of Care Note  Patient: Kristen Mendez  Procedure(s) Performed: Procedure(s): LAPAROSCOPIC ASSISTED VAGINAL HYSTERECTOMY (N/A) LAPAROSCOPIC BILATERAL SALPINGO OOPHORECTOMY (Bilateral) CYSTOSCOPY (N/A)  Patient Location: PACU  Anesthesia Type:General  Level of Consciousness: awake, alert  and oriented  Airway & Oxygen Therapy: Patient Spontanous Breathing and Patient connected to nasal cannula oxygen  Post-op Assessment: Report given to RN and Post -op Vital signs reviewed and stable  Post vital signs: Reviewed and stable  Last Vitals:  Filed Vitals:   07/02/15 1054  BP: 108/54  Pulse: 60  Temp: 36.7 C  Resp: 18    Complications: No apparent anesthesia complications

## 2015-07-02 NOTE — Anesthesia Postprocedure Evaluation (Signed)
  Anesthesia Post-op Note  Patient: Kristen Mendez  Procedure(s) Performed: Procedure(s) (LRB): LAPAROSCOPIC ASSISTED VAGINAL HYSTERECTOMY (N/A) LAPAROSCOPIC BILATERAL SALPINGO OOPHORECTOMY (Bilateral) CYSTOSCOPY (N/A)  Patient Location: PACU  Anesthesia Type: General  Level of Consciousness: awake and alert   Airway and Oxygen Therapy: Patient Spontanous Breathing  Post-op Pain: mild  Post-op Assessment: Post-op Vital signs reviewed, Patient's Cardiovascular Status Stable, Respiratory Function Stable, Patent Airway and No signs of Nausea or vomiting  Last Vitals:  Filed Vitals:   07/02/15 1545  BP: 106/55  Pulse: 71  Temp:   Resp: 13    Post-op Vital Signs: stable   Complications: No apparent anesthesia complications

## 2015-07-02 NOTE — Anesthesia Postprocedure Evaluation (Signed)
  Anesthesia Post-op Note  Patient: Kristen Mendez  Procedure(s) Performed: Procedure(s): LAPAROSCOPIC ASSISTED VAGINAL HYSTERECTOMY (N/A) LAPAROSCOPIC BILATERAL SALPINGO OOPHORECTOMY (Bilateral) CYSTOSCOPY (N/A)  Patient Location: PACU [facility]  Anesthesia Type:General  Level of Consciousness: awake, alert  and oriented  Airway and Oxygen Therapy: Patient Spontanous Breathing  Post-op Pain: none  Post-op Assessment: Post-op Vital signs reviewed and Patient's Cardiovascular Status Stable              Post-op Vital Signs: Reviewed and stable  Last Vitals:  Filed Vitals:   07/02/15 1720  BP: 100/73  Pulse: 78  Temp: 36.9 C  Resp: 12    Complications: No apparent anesthesia complications

## 2015-07-02 NOTE — Anesthesia Preprocedure Evaluation (Signed)
Anesthesia Evaluation  Patient identified by MRN, date of birth, ID band Patient awake    Reviewed: Allergy & Precautions, H&P , Patient's Chart, lab work & pertinent test results, reviewed documented beta blocker date and time   Airway Mallampati: II  TM Distance: >3 FB Neck ROM: full    Dental no notable dental hx.    Pulmonary  breath sounds clear to auscultation  Pulmonary exam normal       Cardiovascular Rhythm:regular Rate:Normal     Neuro/Psych    GI/Hepatic   Endo/Other    Renal/GU      Musculoskeletal   Abdominal   Peds  Hematology   Anesthesia Other Findings Thyroid disease    Hx of acute pancreatitis   Anemia    Hypothyroidism       Reproductive/Obstetrics                             Anesthesia Physical Anesthesia Plan  ASA: II  Anesthesia Plan: General   Post-op Pain Management:    Induction: Intravenous  Airway Management Planned: Oral ETT  Additional Equipment:   Intra-op Plan:   Post-operative Plan: Extubation in OR  Informed Consent: I have reviewed the patients History and Physical, chart, labs and discussed the procedure including the risks, benefits and alternatives for the proposed anesthesia with the patient or authorized representative who has indicated his/her understanding and acceptance.   Dental Advisory Given and Dental advisory given  Plan Discussed with: CRNA and Surgeon  Anesthesia Plan Comments: (  Discussed general anesthesia, including possible nausea, instrumentation of airway, sore throat,pulmonary aspiration, etc. I asked if the were any outstanding questions, or  concerns before we proceeded. )        Anesthesia Quick Evaluation

## 2015-07-03 ENCOUNTER — Encounter (HOSPITAL_COMMUNITY): Payer: Self-pay | Admitting: Obstetrics & Gynecology

## 2015-07-03 DIAGNOSIS — D251 Intramural leiomyoma of uterus: Secondary | ICD-10-CM | POA: Diagnosis not present

## 2015-07-03 LAB — HEMOGLOBIN: Hemoglobin: 9 g/dL — ABNORMAL LOW (ref 12.0–15.0)

## 2015-07-03 MED ORDER — IBUPROFEN 800 MG PO TABS: 800.0000 mg | ORAL_TABLET | Freq: Three times a day (TID) | ORAL | Status: DC | PRN

## 2015-07-03 MED ORDER — OXYCODONE-ACETAMINOPHEN 5-325 MG PO TABS: 1.0000 | ORAL_TABLET | ORAL | Status: DC | PRN

## 2015-07-03 NOTE — Discharge Summary (Signed)
Physician Discharge Summary  Patient ID: Kristen Mendez MRN: [id number] DOB/AGE: 1966/10/25 49 y.o.  Admit date: 07/02/2015 Discharge date: 07/03/2015  Admission Diagnoses: Dysmenorrhea, Fibroid uterus  Discharge Diagnoses:  Active Problems:   S/P laparoscopic assisted vaginal hysterectomy (LAVH)   Discharged Condition: good  Hospital Course: patient taken to OR where below procedures were performed. There were no intraoperative or post operative complications.  Patient met all discharge criteria and was discharged home on POD #1  Consults: None  Significant Diagnostic Studies: labs: Post op Hb 9  Treatments: IV hydration, antibiotics: Ancef and surgery: LAVH BSO Cystoscopy  Discharge Exam: Blood pressure 92/50, pulse 65, temperature 98.5 F (36.9 C), temperature source Oral, resp. rate 16, height $RemoveBe'5\' 7"'RcnDOuabi$  (1.702 m), weight 67.132 kg (148 lb), last menstrual period 06/16/2015, SpO2 98 %. General: alert, cooperative and no distress GI: soft, non-tender; bowel sounds normal; no masses,  no organomegaly, normal findings: bowel sounds normal and incision: clean, dry and intact Extremities: extremities normal, atraumatic, no cyanosis or edema, Homans sign is negative, no sign of DVT and no edema, redness or tenderness in the calves or thighs Vaginal Bleeding: minimal  Disposition: Final discharge disposition not confirmed  Discharge Instructions     Remove dressing in 72 hours    Complete by:  As directed      Call MD for:  difficulty breathing, headache or visual disturbances    Complete by:  As directed      Call MD for:  extreme fatigue    Complete by:  As directed      Call MD for:  hives    Complete by:  As directed      Call MD for:  persistant dizziness or light-headedness    Complete by:  As directed      Call MD for:  persistant nausea and vomiting    Complete by:  As directed      Call MD for:  redness, tenderness, or signs of infection (pain, swelling, redness, odor or  green/yellow discharge around incision site)    Complete by:  As directed      Call MD for:  severe uncontrolled pain    Complete by:  As directed      Call MD for:  temperature >100.4    Complete by:  As directed      Call MD for:    Complete by:  As directed   Abnormal foul smelling vaginal discharge or heavy vaginal bleeding     Diet - low sodium heart healthy    Complete by:  As directed      Discharge instructions    Complete by:  As directed   Call office for post operative visit. Call if you have any questions or concerns     Driving Restrictions    Complete by:  As directed   No driving for 10 to 14 days     Increase activity slowly    Complete by:  As directed      Lifting restrictions    Complete by:  As directed   No lifting anything heavier than 20 pounds     Sexual Activity Restrictions    Complete by:  As directed   No intercourse or anything in vagina until seen at post operative visit in 4 weeks            Medication List    TAKE these medications        ferrous sulfate 325 (65 FE)  MG tablet  Take 325 mg by mouth 2 (two) times a week.     ibuprofen 800 MG tablet  Commonly known as:  ADVIL,MOTRIN  Take 1 tablet (800 mg total) by mouth every 8 (eight) hours as needed (mild pain).     levothyroxine 112 MCG tablet  Commonly known as:  SYNTHROID, LEVOTHROID  Take 112 mcg by mouth daily before breakfast.     multivitamin tablet  Take 1 tablet by mouth daily.     oxyCODONE-acetaminophen 5-325 MG per tablet  Commonly known as:  PERCOCET/ROXICET  Take 1-2 tablets by mouth every 4 (four) hours as needed (moderate to severe pain (when tolerating fluids)).         SignedCaffie Damme 07/03/2015, 9:07 AM

## 2015-07-03 NOTE — Progress Notes (Signed)
1 Day Post-Op Procedure(s) (LRB): LAPAROSCOPIC ASSISTED VAGINAL HYSTERECTOMY (N/A) LAPAROSCOPIC BILATERAL SALPINGO OOPHORECTOMY (Bilateral) CYSTOSCOPY (N/A)  Subjective: Patient reports incisional pain, tolerating PO and + flatus.   Patient denies fever, chills. No shortness of breath or chest pain.  No heavy bleeding.   Objective: I have reviewed patient's vital signs, intake and output, medications and labs.  General: alert, cooperative and no distress GI: soft, non-tender; bowel sounds normal; no masses,  no organomegaly, normal findings: bowel sounds normal and incision: clean, dry and intact Extremities: extremities normal, atraumatic, no cyanosis or edema, Homans sign is negative, no sign of DVT and no edema, redness or tenderness in the calves or thighs Vaginal Bleeding: minimal  Assessment: s/p Procedure(s): LAPAROSCOPIC ASSISTED VAGINAL HYSTERECTOMY (N/A) LAPAROSCOPIC BILATERAL SALPINGO OOPHORECTOMY (Bilateral) CYSTOSCOPY (N/A): stable, progressing well and tolerating diet  Plan: Advance diet Encourage ambulation Discontinue IV fluids D/C foley  If patient meets all discharge criteria will send home.      Kristen Mendez, [place] 07/03/2015, 9:02 AM

## 2015-07-11 NOTE — Op Note (Signed)
OPERATIVE NOTE  JUSTISE EHMANN  DOB:    05-27-1966  MRN:    [id number]  CSN:    [id number]  Date of Surgery:  07/02/2015  Preoperative Diagnosis: Abnormal uterine bleeding / dysmenorrhea  Postoperative Diagnosis: Same as above  Procedures: Laparoscopy Assisted Vaginal Hysterectomy Bilateral Salpingoopherectomy Cystoscopy  Surgeon:  Mady Haagensen. Toddrick Sanna MD  Assistant: Alden Hipp MD  Anesthetic:  General  Disposition:  The patient presents with the above-mentioned diagnosis. She understands the indications for surgical procedure. She also understands the alternative treatment options. She accepts the risk of, but not limited to, anesthetic complications, bleeding, infections, and possible damage to the surrounding organs.  Findings: Exam under anesthesia: external vulva normal vaginal vault normal cervix grossly normal uterus 14 weeks size mobile laparoscopy: Globular appearing uterus with multiple fibroids, normal appearing ovaries and fallopian tubes bilaterally. Normal appearing appendix, normal liver edge and gallbladder. Cystoscopy: normal bladder urothelium brisk jets from each ureteral orifice at end of procedure  Procedure:  The patient was taken to the operating room where a general anesthetic was given. A time out was performed confirming the patient's name and procedure. An examination under anesthesia was performed. The patient's abdomen was prepped with ChloraPrep. The perineum and vagina were prepped with multiple layers of Betadine. A Foley catheter was placed in the bladder. A Grave's speculum was placed in the vagina and the anterior lip of the cervix was held with a single tooth tenaculum. A Hulka tenaculum was placed inside the uterus. The speculum and tenaculum was removed. The patient was sterilely draped. The subumbilical area was injected with half percent Marcaine. An incision was made in the umbilicus with 11 blade scalpel. A 67mm Excel Visiport  trocar was used with l12mm 0 degree laparoscope attached to perform direct entry into the patient's abdomen. Direct video visualization confirmed entry and pneumoperitoneum was obtained using approximately 3L CO2 gas. The patient was placed in steep Trendelenburg position. A small incision was made and a 5 mm trocar was inserted into the abdominal cavity along the right and left pelvis under direct visualization. There was no injury noted with placement of trocars. The pelvic contents were visualized and findings noted above, both ureters were identified crossing the pelvic brim and pelvic sidewall coursing well below operative field. Pictures were taken of the patient's pelvic structures. The left infundibulopelvic ligament was identified, cauterized and cut using the 5 mm Ligasure. The round ligament and ovarian ligaments were sequentially cauterized and cut using the 107mm Ligasure. The bladder flap was developed anteriorly. This was then done on the right side, where the right IP was transected as well as the right round ligament was cauterized and cut using the 20mm Ligasure. The bladder flap was developed further.   At this point we felt we were ready to proceed with the vaginal portion of the procedure. The patient was placed in a more lithotomy position. A weighted speculum was placed in the posterior vagina. The cervix was injected with 1% percent Lidocaine with epinephrine. A circumferential incision was made around the cervix. The vaginal mucosa was advanced anteriorly and posteriorly. The anterior cul-de-sac and in the posterior cul-de-sac were sharply entered. The uterosacral ligaments were clamped with Heaney clamps, transected with Mayo scissors and suture ligated with 0-vicryl. The suture was tagged and left long for culdoplasty later. Alternating from right to left the cardinal ligaments, paracervical tissues, parametrial tissues, were clamped clamped with the Ligasure Impact cauterized and  transected. The uterine vessels were similarly clamped,  cut and suture ligated with 0-vicryl.  In successive similar steps, the broad ligaments bilaterally were disposed of as high as possible, picking up the anterior peritoneum above the uterine vessels with each successive pedicle.    It was noted, that the uterine body was too large to present itself at the introitus, and therefore a lash incision was carried out along both sides of the lower uterine segment in an attempt to invert the uterus.  Completion of the operation using only this method was unsuccessful, and therefore morcellation was performed in that uterus was inverted and the fundus was amputated first followed by several other portions of myometria until there was enough exposure to grasp the remaining uterus with a single tooth tenaculum.   The remaining uterus was partially pulled outside the introitus. The upper portions of the broad ligaments, the uterine ends of the tubes and the suspensory ligaments of the ovaries and the round ligaments on both sides were now identified and were inspected. These structures were incorporated into the corneal portion of the broad ligaments which were clamped with the Ligasure impact, cauterized and transected.   The uterus with bilateral tubes and ovaries were removed and sent to Pathology.  A small wet laparotomy pack was placed into the abdomen and a modified McCall's Culdoplasty was performed by placing suture through the posterior vaginal wall at 4 o'clock, then incorporation of the uterosacral ligament on the left side. The suture was then placed through the posterior peritoneum, the right uterosacral ligament was incorporated and the suture exited through the posterior vagina at 7o'clock. This suture was tied. Transverse closure of the vaginal cuff was then performed with 0-vicryl in running, locked fashion.  The held uterosacral ligament plication sutures were now tied.  Hemostasis was assured.     The foley catheter was removed and the 70-degree cystoscope with camera attached was gently advanced into the bladder 300 cc of Normal saline was introduced into the bladder and a complete bladder survey was performed.  Both ureteral orifices were visualized. There were brisk jets from both UOs and no noted injury, suture or bruising to the bladder urothelium.  The cystoscope was removed slowly while examining the urethra. There was no injury noted to the urethra.  The foley catheter was then replaced .  The operator then changed gown and gloves. The pneumoperitoneum was reestablished. The pelvis was inspected and hemostasis was adequate. We felt that we are ready to end the procedure. The 5 mm trochars were removed under direct visualization. The pneumoperitoneum was allowed to escape. The subumbilical trocar was removed. The subumbilical incision was closed using a deep suture of 2-0 Vicryl followed by skin closures of 3-0 Monocryl. Each incision was covered with Dermabond. The patient tolerated her procedure well. She was awakened from her anesthetic without difficulty and then transported to the recovery room in stable condition. Sponge, needle, and instrument counts were correct on 3 occasions. The estimated blood loss was 150 cc's. 0 Vicryl is the suture material used throughout the procedure. The uterus with tubes and ovaries was sent to pathology.   Kristen Mendez STACIA

## 2015-07-17 ENCOUNTER — Other Ambulatory Visit: Payer: Self-pay | Admitting: Endocrinology

## 2015-07-17 DIAGNOSIS — E041 Nontoxic single thyroid nodule: Secondary | ICD-10-CM

## 2015-10-03 ENCOUNTER — Ambulatory Visit
Admission: RE | Admit: 2015-10-03 | Discharge: 2015-10-03 | Disposition: A | Discharge status: Home / Self Care | Payer: 59 | Source: Ambulatory Visit | Attending: Endocrinology | Admitting: Endocrinology

## 2015-10-03 DIAGNOSIS — E041 Nontoxic single thyroid nodule: Secondary | ICD-10-CM

## 2016-02-20 ENCOUNTER — Other Ambulatory Visit: Payer: Self-pay | Admitting: Obstetrics

## 2016-02-20 DIAGNOSIS — R928 Other abnormal and inconclusive findings on diagnostic imaging of breast: Secondary | ICD-10-CM

## 2016-02-26 ENCOUNTER — Ambulatory Visit
Admission: RE | Admit: 2016-02-26 | Discharge: 2016-02-26 | Disposition: A | Discharge status: Home / Self Care | Payer: BLUE CROSS/BLUE SHIELD | Source: Ambulatory Visit | Attending: Obstetrics | Admitting: Obstetrics

## 2016-02-26 DIAGNOSIS — R928 Other abnormal and inconclusive findings on diagnostic imaging of breast: Secondary | ICD-10-CM

## 2016-05-22 ENCOUNTER — Other Ambulatory Visit: Payer: Self-pay | Admitting: Internal Medicine

## 2016-05-22 DIAGNOSIS — R1013 Epigastric pain: Secondary | ICD-10-CM

## 2016-05-29 ENCOUNTER — Ambulatory Visit
Admission: RE | Admit: 2016-05-29 | Discharge: 2016-05-29 | Disposition: A | Discharge status: Home / Self Care | Payer: BLUE CROSS/BLUE SHIELD | Source: Ambulatory Visit | Attending: Internal Medicine | Admitting: Internal Medicine

## 2016-05-29 DIAGNOSIS — R1013 Epigastric pain: Secondary | ICD-10-CM

## 2016-06-02 ENCOUNTER — Other Ambulatory Visit: Payer: Self-pay | Admitting: Gastroenterology

## 2016-06-06 ENCOUNTER — Ambulatory Visit: Payer: Self-pay | Admitting: General Surgery

## 2016-06-18 ENCOUNTER — Encounter (HOSPITAL_COMMUNITY): Payer: Self-pay | Admitting: *Deleted

## 2016-06-26 NOTE — Anesthesia Preprocedure Evaluation (Addendum)
Anesthesia Evaluation  Patient identified by MRN, date of birth, ID band Patient awake    Reviewed: Allergy & Precautions, NPO status , Patient's Chart, lab work & pertinent test results  Airway Mallampati: II  TM Distance: >3 FB     Dental  (+) Teeth Intact, Dental Advisory Given   Pulmonary neg pulmonary ROS,    breath sounds clear to auscultation       Cardiovascular negative cardio ROS   Rhythm:Regular Rate:Normal     Neuro/Psych negative neurological ROS  negative psych ROS   GI/Hepatic negative GI ROS, Neg liver ROS,   Endo/Other  Hypothyroidism   Renal/GU negative Renal ROS  negative genitourinary   Musculoskeletal negative musculoskeletal ROS (+)   Abdominal Normal abdominal exam  (+)   Peds negative pediatric ROS (+)  Hematology negative hematology ROS (+)   Anesthesia Other Findings   Reproductive/Obstetrics negative OB ROS                           Anesthesia Physical Anesthesia Plan  ASA: II  Anesthesia Plan: General   Post-op Pain Management:    Induction: Intravenous  Airway Management Planned: Oral ETT  Additional Equipment:   Intra-op Plan:   Post-operative Plan: Extubation in OR  Informed Consent: I have reviewed the patients History and Physical, chart, labs and discussed the procedure including the risks, benefits and alternatives for the proposed anesthesia with the patient or authorized representative who has indicated his/her understanding and acceptance.   Dental advisory given  Plan Discussed with: CRNA  Anesthesia Plan Comments:         Anesthesia Quick Evaluation

## 2016-06-26 NOTE — H&P (Signed)
  Kristen Mendez HPI: Since Thanksgiving/Christmas 2016 she reports 3-4 episodes of abdominal pain. She attributed the symptoms to over eating, but with further reflection, she only over ate once. The pain can be severe, but it is not all the time. She has a history of pancreatitis at the age of 82, but no issues until the past several years. As a result of her symptoms she was encouraged by her sister to undergo further work up. An ultrasound was performed and she was positive for stones in the gallbladder. Additionally, the CBD reveals a 7.6 mm stone in the dilated CBD at 10 mm. No evidence of pancreatitis by serology.  Past Medical History  Diagnosis Date  . Thyroid disease   . Hx of acute pancreatitis 1981  . Hx gestational diabetes 1996  . SVD (spontaneous vaginal delivery)     x 1  . Anemia   . Hypothyroidism     Past Surgical History  Procedure Laterality Date  . Cesarean section  1996  . Left thyroid surgery  2015  . Laparotomy      pancreatitis  . Tubal ligation    . Laparoscopic assisted vaginal hysterectomy N/A 07/02/2015    Procedure: LAPAROSCOPIC ASSISTED VAGINAL HYSTERECTOMY;  Surgeon: Sanjuana Kava, MD;  Location: [facility];  Service: Gynecology;  Laterality: N/A;  . Laparoscopic bilateral salpingo oopherectomy Bilateral 07/02/2015    Procedure: LAPAROSCOPIC BILATERAL SALPINGO OOPHORECTOMY;  Surgeon: Sanjuana Kava, MD;  Location: [facility];  Service: Gynecology;  Laterality: Bilateral;  . Cystoscopy N/A 07/02/2015    Procedure: CYSTOSCOPY;  Surgeon: Sanjuana Kava, MD;  Location: [facility];  Service: Gynecology;  Laterality: N/A;    Family History  Problem Relation Age of Onset  . Diabetes Father     Social History:  reports that she has never smoked. She has never used smokeless tobacco. She reports that she does not drink alcohol or use illicit drugs.  Allergies: No Known Allergies  Medications: Scheduled: Continuous:  No results found for this or any previous visit (from the past  24 hour(s)).   No results found.  ROS:  As stated above in the HPI otherwise negative.  Last menstrual period 06/10/2015.    PE: Gen: NAD, Alert and Oriented HEENT:  [place]/AT, EOMI Neck: Supple, no LAD Lungs: CTA Bilaterally CV: RRR without M/G/R ABM: Soft, NTND, +BS Ext: No C/C/E  Assessment/Plan: 1) CBD stone - ERCP  Javar Eshbach D 06/26/2016, 12:51 PM

## 2016-06-27 ENCOUNTER — Encounter (HOSPITAL_COMMUNITY): Payer: Self-pay | Admitting: *Deleted

## 2016-06-27 ENCOUNTER — Encounter (HOSPITAL_COMMUNITY)
Admission: RE | Disposition: A | Discharge status: Home / Self Care | Payer: Self-pay | Source: Ambulatory Visit | Attending: Gastroenterology

## 2016-06-27 ENCOUNTER — Ambulatory Visit (HOSPITAL_COMMUNITY): Payer: BLUE CROSS/BLUE SHIELD | Admitting: Anesthesiology

## 2016-06-27 ENCOUNTER — Ambulatory Visit (HOSPITAL_COMMUNITY)
Admission: RE | Admit: 2016-06-27 | Discharge: 2016-06-27 | Disposition: A | Discharge status: Home / Self Care | Payer: BLUE CROSS/BLUE SHIELD | Source: Ambulatory Visit | Attending: Gastroenterology | Admitting: Gastroenterology

## 2016-06-27 ENCOUNTER — Ambulatory Visit (HOSPITAL_COMMUNITY): Payer: BLUE CROSS/BLUE SHIELD

## 2016-06-27 ENCOUNTER — Encounter (HOSPITAL_COMMUNITY): Payer: Self-pay

## 2016-06-27 DIAGNOSIS — K8050 Calculus of bile duct without cholangitis or cholecystitis without obstruction: Secondary | ICD-10-CM | POA: Diagnosis not present

## 2016-06-27 DIAGNOSIS — R109 Unspecified abdominal pain: Secondary | ICD-10-CM | POA: Diagnosis present

## 2016-06-27 HISTORY — PX: ERCP: SHX5425

## 2016-06-27 SURGERY — ERCP, WITH INTERVENTION IF INDICATED
Anesthesia: General

## 2016-06-27 MED ORDER — ONDANSETRON HCL 4 MG/2ML IJ SOLN
INTRAMUSCULAR | Status: DC | PRN
  Administered 2016-06-27: 4 mg via INTRAVENOUS

## 2016-06-27 MED ORDER — FENTANYL CITRATE (PF) 100 MCG/2ML IJ SOLN
INTRAMUSCULAR | Status: DC | PRN
  Administered 2016-06-27: 50 ug via INTRAVENOUS

## 2016-06-27 MED ORDER — SODIUM CHLORIDE 0.9 % IV SOLN
INTRAVENOUS | Status: DC | PRN
  Administered 2016-06-27: 40 mL

## 2016-06-27 MED ORDER — SUCCINYLCHOLINE CHLORIDE 20 MG/ML IJ SOLN
INTRAMUSCULAR | Status: DC | PRN
  Administered 2016-06-27: 100 mg via INTRAVENOUS

## 2016-06-27 MED ORDER — LACTATED RINGERS IV SOLN
INTRAVENOUS | Status: DC
  Administered 2016-06-27: 1000 mL via INTRAVENOUS

## 2016-06-27 MED ORDER — EPINEPHRINE HCL 0.1 MG/ML IJ SOSY
PREFILLED_SYRINGE | INTRAMUSCULAR | Status: AC
  Filled 2016-06-27: qty 10

## 2016-06-27 MED ORDER — SODIUM CHLORIDE 0.9 % IV SOLN: INTRAVENOUS | Status: DC

## 2016-06-27 MED ORDER — EPHEDRINE SULFATE 50 MG/ML IJ SOLN
INTRAMUSCULAR | Status: AC
  Filled 2016-06-27: qty 1

## 2016-06-27 MED ORDER — CIPROFLOXACIN IN D5W 400 MG/200ML IV SOLN
INTRAVENOUS | Status: DC | PRN
  Administered 2016-06-27: 400 mg via INTRAVENOUS

## 2016-06-27 MED ORDER — ROCURONIUM BROMIDE 100 MG/10ML IV SOLN
INTRAVENOUS | Status: DC | PRN
  Administered 2016-06-27: 15 mg via INTRAVENOUS

## 2016-06-27 MED ORDER — SODIUM CHLORIDE 0.9 % IJ SOLN
INTRAMUSCULAR | Status: AC
  Filled 2016-06-27: qty 10

## 2016-06-27 MED ORDER — FENTANYL CITRATE (PF) 100 MCG/2ML IJ SOLN
INTRAMUSCULAR | Status: AC
  Filled 2016-06-27: qty 2

## 2016-06-27 MED ORDER — ONDANSETRON HCL 4 MG PO TABS
4.0000 mg | ORAL_TABLET | Freq: Once | ORAL | Status: AC
  Administered 2016-06-27: 4 mg via ORAL
  Filled 2016-06-27: qty 1

## 2016-06-27 MED ORDER — PROPOFOL 10 MG/ML IV BOLUS
INTRAVENOUS | Status: AC
  Filled 2016-06-27: qty 20

## 2016-06-27 MED ORDER — LIDOCAINE HCL (CARDIAC) 20 MG/ML IV SOLN
INTRAVENOUS | Status: DC | PRN
  Administered 2016-06-27: 80 mg via INTRAVENOUS

## 2016-06-27 MED ORDER — CIPROFLOXACIN IN D5W 400 MG/200ML IV SOLN: 400.0000 mg | Freq: Once | INTRAVENOUS | Status: DC

## 2016-06-27 MED ORDER — LIDOCAINE HCL (CARDIAC) 20 MG/ML IV SOLN
INTRAVENOUS | Status: AC
  Filled 2016-06-27: qty 5

## 2016-06-27 MED ORDER — SODIUM CHLORIDE 0.9 % IJ SOLN
PREFILLED_SYRINGE | INTRAMUSCULAR | Status: DC | PRN
  Administered 2016-06-27: 2.5 mL

## 2016-06-27 MED ORDER — CIPROFLOXACIN IN D5W 400 MG/200ML IV SOLN
INTRAVENOUS | Status: AC
  Filled 2016-06-27: qty 200

## 2016-06-27 MED ORDER — PROPOFOL 10 MG/ML IV BOLUS
INTRAVENOUS | Status: DC | PRN
  Administered 2016-06-27: 140 mg via INTRAVENOUS

## 2016-06-27 MED ORDER — INDOMETHACIN 50 MG RE SUPP
RECTAL | Status: AC
  Filled 2016-06-27: qty 2

## 2016-06-27 MED ORDER — ONDANSETRON HCL 4 MG/2ML IJ SOLN
INTRAMUSCULAR | Status: AC
  Filled 2016-06-27: qty 2

## 2016-06-27 MED ORDER — GLUCAGON HCL RDNA (DIAGNOSTIC) 1 MG IJ SOLR
INTRAMUSCULAR | Status: AC
  Filled 2016-06-27: qty 1

## 2016-06-27 MED ORDER — ROCURONIUM BROMIDE 100 MG/10ML IV SOLN
INTRAVENOUS | Status: AC
  Filled 2016-06-27: qty 1

## 2016-06-27 MED ORDER — SUGAMMADEX SODIUM 200 MG/2ML IV SOLN
INTRAVENOUS | Status: DC | PRN
  Administered 2016-06-27: 130 mg via INTRAVENOUS

## 2016-06-27 NOTE — Anesthesia Postprocedure Evaluation (Signed)
Anesthesia Post Note  Patient: Kristen Mendez  Procedure(s) Performed: Procedure(s) (LRB): ENDOSCOPIC RETROGRADE CHOLANGIOPANCREATOGRAPHY (ERCP) (N/A)  Patient location during evaluation: PACU Anesthesia Type: General Level of consciousness: awake and alert Pain management: pain level controlled Vital Signs Assessment: post-procedure vital signs reviewed and stable Respiratory status: spontaneous breathing, nonlabored ventilation, respiratory function stable and patient connected to nasal cannula oxygen Cardiovascular status: blood pressure returned to baseline and stable Postop Assessment: no signs of nausea or vomiting Anesthetic complications: no    Last Vitals:  Filed Vitals:   06/27/16 1000 06/27/16 1010  BP: 136/86   Pulse: 57 58  Temp:    Resp: 13 13    Last Pain:  Filed Vitals:   06/27/16 1011  PainSc: 2                  Effie Berkshire

## 2016-06-27 NOTE — Transfer of Care (Signed)
Immediate Anesthesia Transfer of Care Note  Patient: Kristen Mendez  Procedure(s) Performed: Procedure(s): ENDOSCOPIC RETROGRADE CHOLANGIOPANCREATOGRAPHY (ERCP) (N/A)  Patient Location: PACU and Endoscopy Unit  Anesthesia Type:General  Level of Consciousness: awake, alert , oriented and patient cooperative  Airway & Oxygen Therapy: Patient Spontanous Breathing and Patient connected to face mask oxygen  Post-op Assessment: Report given to RN, Post -op Vital signs reviewed and stable and Patient moving all extremities  Post vital signs: Reviewed and stable  Last Vitals:  Filed Vitals:   06/27/16 0700  BP: 118/83  Pulse: 55  Temp: 36.8 C  Resp: 15    Last Pain: There were no vitals filed for this visit.       Complications: No apparent anesthesia complications

## 2016-06-27 NOTE — Discharge Instructions (Signed)
Endoscopic Retrograde Cholangiopancreatography (ERCP), Care After °Refer to this sheet in the next few weeks. These instructions provide you with information on caring for yourself after your procedure. Your health care provider may also give you more specific instructions. Your treatment has been planned according to current medical practices, but problems sometimes occur. Call your health care provider if you have any problems or questions after your procedure.  °WHAT TO EXPECT AFTER THE PROCEDURE  °After your procedure, it is typical to feel:  °· Soreness in your throat.   °· Sick to your stomach (nauseous).   °· Bloated. °· Dizzy.   °· Fatigued. °HOME CARE INSTRUCTIONS °· Have a friend or family member stay with you for the first 24 hours after your procedure. °· Start taking your usual medicines and eating normally as soon as you feel well enough to do so or as directed by your health care provider. °SEEK MEDICAL CARE IF: °· You have abdominal pain.   °· You develop signs of infection, such as:   °¨ Chills.   °¨ Feeling unwell.   °SEEK IMMEDIATE MEDICAL CARE IF: °· You have difficulty swallowing. °· You have worsening throat, chest, or abdominal pain. °· You vomit. °· You have bloody or very black stools. °· You have a fever. °  °This information is not intended to replace advice given to you by your health care provider. Make sure you discuss any questions you have with your health care provider. °  °Document Released: 09/28/2013 Document Reviewed: 09/28/2013 °[employer]vier Interactive Patient Education ©2016 [employer] ° °

## 2016-06-27 NOTE — Op Note (Signed)
[facility] Patient Name: Kristen Mendez Procedure Date: 06/27/2016 MRN: [id number] Attending MD: Carol Ada , MD Date of Birth: 16-Nov-1966 CSN: [id number] Age: 50 Admit Type: Outpatient Procedure:                ERCP Indications:              For therapy of bile duct stone(s) Providers:                Carol Ada, MD, Laverta Baltimore, RN, Elspeth Cho, Technician, Courtney Heys. Armist[place], CRNA Referring MD:              Medicines:                General Anesthesia and Indomethacin 100 mg PR. Complications:            No immediate complications. Estimated Blood Loss:     See above. Procedure:                Pre-Anesthesia Assessment:                           - Prior to the procedure, a History and Physical                            was performed, and patient medications and                            allergies were reviewed. The patient's tolerance of                            previous anesthesia was also reviewed. The risks                            and benefits of the procedure and the sedation                            options and risks were discussed with the patient.                            All questions were answered, and informed consent                            was obtained. Prior Anticoagulants: The patient has                            taken no previous anticoagulant or antiplatelet                            agents. ASA Grade Assessment: I - A normal, healthy                            patient. After reviewing the risks and benefits,  the patient was deemed in satisfactory condition to                            undergo the procedure.                           - Sedation was administered by an anesthesia                            professional. General anesthesia was attained.                           After obtaining informed consent, the scope was                            passed under direct  vision. Throughout the                            procedure, the patient's blood pressure, pulse, and                            oxygen saturations were monitored continuously. The                            [id number] scope was introduced through                            the mouth, and used to inject contrast into and                            used to inject contrast into the bile duct and                            dorsal pancreatic duct. The ERCP was accomplished                            without difficulty. The patient tolerated the                            procedure well. Scope In: Scope Out: Findings:      The biliary tree was swept with a 15 mm balloon starting at the       bifurcation. Three stones were removed. No stones remained. Estimated       blood loss: 9 mL requiring treatment with epinephrine.      The initial cannulation attempt resulted in cannulation of the proximal       PD. A total of three proximal PD cannulations occurred. Subsequently the       CBD was cannulated and the guidewire was secured in the left       intrahepatic ducts. Contrast injection into the CBD revealed a marked       dilation at 12-15 mm as well as some intrahepatic biliary ductal       dilation. Several stones were noted on fluoroscopy. A large       sphincterotomy was created, approximately 15 mm as  the patient has a       long papilla. With the initial sweep there was some resistance with       extraction of the first stone. The stone was large when viewed after       extraction, but this did traumatize the sphincterotomy site. Some mild       to moderate oozing was noted. Two more stones were extracted, one large       and a smaller one, with several more sweeps of the CBD. The final       occlusion cholangiogram was negative for any retained stones and       contrast quickly drained from the CBD. No evidence of any contrast       leakage or gross visualization of perforation.  The bleeding was       spontaneously slowing down and it was difficult to discern if the       patient was bleeding versus drainage of the CBD with blood and contrast.       Epinepherine 1:10,000 was injected in the distal CBD near the ampulla.       After this maneuver no further bleeding was noted. For extrameasure, 0.5       ml of 1:10,000 epinephrine was injected on both sides of the papilla       near the apex of the sphincterotomy site. Impression:               - Choledocholithiasis was found. Complete removal                            was accomplished by balloon extraction.                           - The biliary tree was swept. Moderate Sedation:      N/A- Per Anesthesia Care Recommendation:           - Refer to a surgeon for cholecystectomy. Procedure Code(s):        --- Professional ---                           [phone], Endoscopic retrograde                            cholangiopancreatography (ERCP); with removal of                            calculi/debris from biliary/pancreatic duct(s) Diagnosis Code(s):        --- Professional ---                           K80.50, Calculus of bile duct without cholangitis                            or cholecystitis without obstruction CPT copyright 2016 American Medical Association. All rights reserved. The codes documented in this report are preliminary and upon coder review may  be revised to meet current compliance requirements. Carol Ada, MD Carol Ada, MD 06/27/2016 9:20:46 AM This report has been signed electronically. Number of Addenda: 0

## 2016-06-27 NOTE — Anesthesia Procedure Notes (Signed)
Procedure Name: Intubation Date/Time: 06/27/2016 8:14 AM Performed by: Carleene Cooper A Pre-anesthesia Checklist: Patient identified, Timeout performed, Emergency Drugs available, Suction available and Patient being monitored Patient Re-evaluated:Patient Re-evaluated prior to inductionOxygen Delivery Method: Circle system utilized Preoxygenation: Pre-oxygenation with 100% oxygen Intubation Type: IV induction Ventilation: Mask ventilation without difficulty Laryngoscope Size: Mac and 4 Grade View: Grade I Tube type: Oral Tube size: 7.5 mm Number of attempts: 1 Airway Equipment and Method: Stylet Placement Confirmation: breath sounds checked- equal and bilateral,  ETT inserted through vocal cords under direct vision and positive ETCO2 Secured at: 21 cm Tube secured with: Tape Dental Injury: Teeth and Oropharynx as per pre-operative assessment

## 2016-06-30 ENCOUNTER — Encounter (HOSPITAL_COMMUNITY): Payer: Self-pay | Admitting: *Deleted

## 2016-07-01 ENCOUNTER — Encounter (HOSPITAL_COMMUNITY)
Admission: RE | Disposition: A | Discharge status: Home / Self Care | Payer: Self-pay | Source: Ambulatory Visit | Attending: General Surgery

## 2016-07-01 ENCOUNTER — Ambulatory Visit (HOSPITAL_COMMUNITY)
Admission: RE | Admit: 2016-07-01 | Discharge: 2016-07-01 | Disposition: A | Discharge status: Home / Self Care | Payer: BLUE CROSS/BLUE SHIELD | Source: Ambulatory Visit | Attending: General Surgery | Admitting: General Surgery

## 2016-07-01 ENCOUNTER — Ambulatory Visit (HOSPITAL_COMMUNITY): Payer: BLUE CROSS/BLUE SHIELD | Admitting: Certified Registered"

## 2016-07-01 ENCOUNTER — Encounter (HOSPITAL_COMMUNITY): Payer: Self-pay | Admitting: *Deleted

## 2016-07-01 ENCOUNTER — Ambulatory Visit (HOSPITAL_COMMUNITY): Payer: BLUE CROSS/BLUE SHIELD

## 2016-07-01 DIAGNOSIS — Z419 Encounter for procedure for purposes other than remedying health state, unspecified: Secondary | ICD-10-CM

## 2016-07-01 DIAGNOSIS — R109 Unspecified abdominal pain: Secondary | ICD-10-CM | POA: Diagnosis present

## 2016-07-01 DIAGNOSIS — K8010 Calculus of gallbladder with chronic cholecystitis without obstruction: Secondary | ICD-10-CM | POA: Insufficient documentation

## 2016-07-01 DIAGNOSIS — Z79899 Other long term (current) drug therapy: Secondary | ICD-10-CM | POA: Diagnosis not present

## 2016-07-01 DIAGNOSIS — E039 Hypothyroidism, unspecified: Secondary | ICD-10-CM | POA: Insufficient documentation

## 2016-07-01 HISTORY — PX: CHOLECYSTECTOMY: SHX55

## 2016-07-01 LAB — CBC WITH DIFFERENTIAL/PLATELET
Basophils Absolute: 0 10*3/uL (ref 0.0–0.1)
Basophils Relative: 1 %
Eosinophils Absolute: 0.1 10*3/uL (ref 0.0–0.7)
Eosinophils Relative: 2 %
HEMATOCRIT: 42.4 % (ref 36.0–46.0)
HEMOGLOBIN: 14.6 g/dL (ref 12.0–15.0)
LYMPHS ABS: 1.8 10*3/uL (ref 0.7–4.0)
Lymphocytes Relative: 44 %
MCH: 29.9 pg (ref 26.0–34.0)
MCHC: 34.4 g/dL (ref 30.0–36.0)
MCV: 86.7 fL (ref 78.0–100.0)
MONOS PCT: 7 %
Monocytes Absolute: 0.3 10*3/uL (ref 0.1–1.0)
NEUTROS ABS: 1.9 10*3/uL (ref 1.7–7.7)
NEUTROS PCT: 46 %
Platelets: 198 10*3/uL (ref 150–400)
RBC: 4.89 MIL/uL (ref 3.87–5.11)
RDW: 12.9 % (ref 11.5–15.5)
WBC: 4.2 10*3/uL (ref 4.0–10.5)

## 2016-07-01 LAB — COMPREHENSIVE METABOLIC PANEL
ALK PHOS: 74 U/L (ref 38–126)
ALT: 34 U/L (ref 14–54)
ANION GAP: 9 (ref 5–15)
AST: 25 U/L (ref 15–41)
Albumin: 5.1 g/dL — ABNORMAL HIGH (ref 3.5–5.0)
BILIRUBIN TOTAL: 0.8 mg/dL (ref 0.3–1.2)
BUN: 16 mg/dL (ref 6–20)
CALCIUM: 9.5 mg/dL (ref 8.9–10.3)
CO2: 27 mmol/L (ref 22–32)
Chloride: 103 mmol/L (ref 101–111)
Creatinine, Ser: 0.57 mg/dL (ref 0.44–1.00)
GLUCOSE: 103 mg/dL — AB (ref 65–99)
Potassium: 3.8 mmol/L (ref 3.5–5.1)
Sodium: 139 mmol/L (ref 135–145)
TOTAL PROTEIN: 8 g/dL (ref 6.5–8.1)

## 2016-07-01 SURGERY — LAPAROSCOPIC CHOLECYSTECTOMY WITH INTRAOPERATIVE CHOLANGIOGRAM
Anesthesia: General | Site: Abdomen

## 2016-07-01 MED ORDER — SUGAMMADEX SODIUM 200 MG/2ML IV SOLN
INTRAVENOUS | Status: DC | PRN
  Administered 2016-07-01: 150 mg via INTRAVENOUS

## 2016-07-01 MED ORDER — OXYCODONE HCL 5 MG PO TABS: 5.0000 mg | ORAL_TABLET | Freq: Once | ORAL | Status: DC | PRN

## 2016-07-01 MED ORDER — ONDANSETRON HCL 4 MG/2ML IJ SOLN
INTRAMUSCULAR | Status: AC
Start: 2016-07-01 — End: 2016-07-01
  Filled 2016-07-01: qty 2

## 2016-07-01 MED ORDER — BUPIVACAINE-EPINEPHRINE 0.25% -1:200000 IJ SOLN
INTRAMUSCULAR | Status: DC | PRN
  Administered 2016-07-01: 20 mL

## 2016-07-01 MED ORDER — BUPIVACAINE-EPINEPHRINE 0.25% -1:200000 IJ SOLN
INTRAMUSCULAR | Status: AC
  Filled 2016-07-01: qty 1

## 2016-07-01 MED ORDER — LACTATED RINGERS IV SOLN: INTRAVENOUS | Status: DC

## 2016-07-01 MED ORDER — MEPERIDINE HCL 50 MG/ML IJ SOLN: 6.2500 mg | INTRAMUSCULAR | Status: DC | PRN

## 2016-07-01 MED ORDER — SODIUM CHLORIDE 0.9 % IJ SOLN
INTRAMUSCULAR | Status: AC
  Filled 2016-07-01: qty 10

## 2016-07-01 MED ORDER — HYDROCODONE-ACETAMINOPHEN 5-325 MG PO TABS: 1.0000 | ORAL_TABLET | Freq: Four times a day (QID) | ORAL | Status: DC | PRN

## 2016-07-01 MED ORDER — LACTATED RINGERS IV SOLN
INTRAVENOUS | Status: DC | PRN
  Administered 2016-07-01 (×2): via INTRAVENOUS

## 2016-07-01 MED ORDER — LIDOCAINE HCL (CARDIAC) 20 MG/ML IV SOLN
INTRAVENOUS | Status: AC
  Filled 2016-07-01: qty 5

## 2016-07-01 MED ORDER — KETOROLAC TROMETHAMINE 30 MG/ML IJ SOLN
INTRAMUSCULAR | Status: DC | PRN
  Administered 2016-07-01: 30 mg via INTRAVENOUS

## 2016-07-01 MED ORDER — GLYCOPYRROLATE 0.2 MG/ML IJ SOLN
INTRAMUSCULAR | Status: DC | PRN
  Administered 2016-07-01: 0.2 mg via INTRAVENOUS

## 2016-07-01 MED ORDER — FENTANYL CITRATE (PF) 100 MCG/2ML IJ SOLN
INTRAMUSCULAR | Status: AC
  Filled 2016-07-01: qty 2

## 2016-07-01 MED ORDER — 0.9 % SODIUM CHLORIDE (POUR BTL) OPTIME
TOPICAL | Status: DC | PRN
Start: 2016-07-01 — End: 2016-07-01
  Administered 2016-07-01: 1000 mL

## 2016-07-01 MED ORDER — PROPOFOL 10 MG/ML IV BOLUS
INTRAVENOUS | Status: DC | PRN
  Administered 2016-07-01: 130 mg via INTRAVENOUS

## 2016-07-01 MED ORDER — DEXAMETHASONE SODIUM PHOSPHATE 10 MG/ML IJ SOLN
INTRAMUSCULAR | Status: DC | PRN
  Administered 2016-07-01: 10 mg via INTRAVENOUS

## 2016-07-01 MED ORDER — LACTATED RINGERS IR SOLN
Status: DC | PRN
Start: 2016-07-01 — End: 2016-07-01
  Administered 2016-07-01: 1000 mL

## 2016-07-01 MED ORDER — LIDOCAINE HCL (CARDIAC) 20 MG/ML IV SOLN
INTRAVENOUS | Status: DC | PRN
  Administered 2016-07-01: 80 mg via INTRAVENOUS

## 2016-07-01 MED ORDER — ACETAMINOPHEN 500 MG PO TABS
1000.0000 mg | ORAL_TABLET | ORAL | Status: AC
  Administered 2016-07-01: 1000 mg via ORAL
  Filled 2016-07-01: qty 2

## 2016-07-01 MED ORDER — FENTANYL CITRATE (PF) 250 MCG/5ML IJ SOLN
INTRAMUSCULAR | Status: DC | PRN
  Administered 2016-07-01: 50 ug via INTRAVENOUS
  Administered 2016-07-01: 100 ug via INTRAVENOUS
  Administered 2016-07-01 (×2): 50 ug via INTRAVENOUS

## 2016-07-01 MED ORDER — IOPAMIDOL (ISOVUE-300) INJECTION 61%
INTRAVENOUS | Status: DC | PRN
Start: 2016-07-01 — End: 2016-07-01
  Administered 2016-07-01: 15 mL

## 2016-07-01 MED ORDER — PROMETHAZINE HCL 25 MG/ML IJ SOLN
6.2500 mg | INTRAMUSCULAR | Status: AC | PRN
  Administered 2016-07-01 (×2): 6.25 mg via INTRAVENOUS
  Filled 2016-07-01: qty 1

## 2016-07-01 MED ORDER — ACETAMINOPHEN 325 MG PO TABS: 325.0000 mg | ORAL_TABLET | ORAL | Status: DC | PRN

## 2016-07-01 MED ORDER — SUCCINYLCHOLINE CHLORIDE 20 MG/ML IJ SOLN
INTRAMUSCULAR | Status: DC | PRN
  Administered 2016-07-01: 15 mg via INTRAVENOUS
  Administered 2016-07-01: 100 mg via INTRAVENOUS

## 2016-07-01 MED ORDER — CEFOTETAN DISODIUM-DEXTROSE 2-2.08 GM-% IV SOLR
2.0000 g | INTRAVENOUS | Status: AC
  Administered 2016-07-01: 2 g via INTRAVENOUS

## 2016-07-01 MED ORDER — GLYCOPYRROLATE 0.2 MG/ML IJ SOLN
INTRAMUSCULAR | Status: AC
  Filled 2016-07-01: qty 3

## 2016-07-01 MED ORDER — GABAPENTIN 300 MG PO CAPS
300.0000 mg | ORAL_CAPSULE | ORAL | Status: AC
  Administered 2016-07-01: 300 mg via ORAL
  Filled 2016-07-01: qty 1

## 2016-07-01 MED ORDER — CELECOXIB 200 MG PO CAPS
400.0000 mg | ORAL_CAPSULE | ORAL | Status: AC
  Administered 2016-07-01: 400 mg via ORAL
  Filled 2016-07-01: qty 2

## 2016-07-01 MED ORDER — ONDANSETRON HCL 4 MG/2ML IJ SOLN
INTRAMUSCULAR | Status: DC | PRN
  Administered 2016-07-01: 4 mg via INTRAVENOUS

## 2016-07-01 MED ORDER — IBUPROFEN 800 MG PO TABS: 800.0000 mg | ORAL_TABLET | Freq: Three times a day (TID) | ORAL | Status: DC | PRN

## 2016-07-01 MED ORDER — PROMETHAZINE HCL 25 MG/ML IJ SOLN
INTRAMUSCULAR | Status: AC
  Administered 2016-07-01: 6.25 mg via INTRAVENOUS
  Filled 2016-07-01: qty 1

## 2016-07-01 MED ORDER — MIDAZOLAM HCL 2 MG/2ML IJ SOLN
INTRAMUSCULAR | Status: DC | PRN
  Administered 2016-07-01: 2 mg via INTRAVENOUS

## 2016-07-01 MED ORDER — ACETAMINOPHEN 160 MG/5ML PO SOLN
325.0000 mg | ORAL | Status: DC | PRN
Start: 2016-07-01 — End: 2016-07-01

## 2016-07-01 MED ORDER — PROPOFOL 10 MG/ML IV BOLUS
INTRAVENOUS | Status: AC
  Filled 2016-07-01: qty 20

## 2016-07-01 MED ORDER — GLYCOPYRROLATE 0.2 MG/ML IJ SOLN
INTRAMUSCULAR | Status: AC
  Filled 2016-07-01: qty 1

## 2016-07-01 MED ORDER — OXYCODONE HCL 5 MG/5ML PO SOLN: 5.0000 mg | Freq: Once | ORAL | Status: DC | PRN

## 2016-07-01 MED ORDER — NEOSTIGMINE METHYLSULFATE 10 MG/10ML IV SOLN
INTRAVENOUS | Status: AC
  Filled 2016-07-01: qty 1

## 2016-07-01 MED ORDER — ROCURONIUM BROMIDE 100 MG/10ML IV SOLN
INTRAVENOUS | Status: DC | PRN
  Administered 2016-07-01: 10 mg via INTRAVENOUS
  Administered 2016-07-01 (×2): 15 mg via INTRAVENOUS

## 2016-07-01 MED ORDER — FENTANYL CITRATE (PF) 250 MCG/5ML IJ SOLN
INTRAMUSCULAR | Status: AC
  Filled 2016-07-01: qty 5

## 2016-07-01 MED ORDER — FENTANYL CITRATE (PF) 100 MCG/2ML IJ SOLN: 25.0000 ug | INTRAMUSCULAR | Status: DC | PRN

## 2016-07-01 MED ORDER — IOPAMIDOL (ISOVUE-300) INJECTION 61%
INTRAVENOUS | Status: AC
  Filled 2016-07-01: qty 50

## 2016-07-01 MED ORDER — CEFOTETAN DISODIUM-DEXTROSE 2-2.08 GM-% IV SOLR
INTRAVENOUS | Status: AC
  Filled 2016-07-01: qty 50

## 2016-07-01 MED ORDER — CHLORHEXIDINE GLUCONATE CLOTH 2 % EX PADS: 6.0000 | MEDICATED_PAD | Freq: Once | CUTANEOUS | Status: DC

## 2016-07-01 MED ORDER — KETOROLAC TROMETHAMINE 30 MG/ML IJ SOLN
INTRAMUSCULAR | Status: AC
  Filled 2016-07-01: qty 2

## 2016-07-01 MED ORDER — ONDANSETRON HCL 4 MG/2ML IJ SOLN
INTRAMUSCULAR | Status: AC
  Filled 2016-07-01: qty 2

## 2016-07-01 MED ORDER — MIDAZOLAM HCL 2 MG/2ML IJ SOLN
INTRAMUSCULAR | Status: AC
  Filled 2016-07-01: qty 2

## 2016-07-01 SURGICAL SUPPLY — 43 items
APPLIER CLIP ROT 10 11.4 M/L (STAPLE)
APR CLP MED LRG 11.4X10 (STAPLE)
BAG SPEC RTRVL 10 TROC 200 (ENDOMECHANICALS) ×1
CABLE HIGH FREQUENCY MONO STRZ (ELECTRODE) ×2 IMPLANT
CATH CHOLANG 76X19 KUMAR (CATHETERS) ×2 IMPLANT
CHLORAPREP W/TINT 26ML (MISCELLANEOUS) ×2 IMPLANT
CLIP APPLIE ROT 10 11.4 M/L (STAPLE) IMPLANT
CLIP LIGATING HEM O LOK PURPLE (MISCELLANEOUS) ×1 IMPLANT
CLIP LIGATING HEMO LOK XL GOLD (MISCELLANEOUS) ×1 IMPLANT
COVER MAYO STAND STRL (DRAPES) ×2 IMPLANT
COVER SURGICAL LIGHT HANDLE (MISCELLANEOUS) ×2 IMPLANT
DECANTER SPIKE VIAL GLASS SM (MISCELLANEOUS) ×2 IMPLANT
DEVICE PMI PUNCTURE CLOSURE (MISCELLANEOUS) ×2 IMPLANT
DRAIN CHANNEL 19F RND (DRAIN) IMPLANT
DRAPE C-ARM 42X120 X-RAY (DRAPES) ×2 IMPLANT
DRAPE LAPAROSCOPIC ABDOMINAL (DRAPES) ×2 IMPLANT
ENDOLOOP SUT PDS II  0 18 (SUTURE) ×1
ENDOLOOP SUT PDS II 0 18 (SUTURE) IMPLANT
EVACUATOR SILICONE 100CC (DRAIN) IMPLANT
GLOVE BIOGEL PI IND STRL 7.0 (GLOVE) ×1 IMPLANT
GLOVE BIOGEL PI INDICATOR 7.0 (GLOVE) ×1
GLOVE SURG SS PI 7.0 STRL IVOR (GLOVE) ×2 IMPLANT
GOWN STRL REUS W/TWL LRG LVL3 (GOWN DISPOSABLE) ×2 IMPLANT
GOWN STRL REUS W/TWL XL LVL3 (GOWN DISPOSABLE) ×4 IMPLANT
KIT BASIN OR (CUSTOM PROCEDURE TRAY) ×2 IMPLANT
LIQUID BAND (GAUZE/BANDAGES/DRESSINGS) ×2 IMPLANT
POUCH RETRIEVAL ECOSAC 10 (ENDOMECHANICALS) ×1 IMPLANT
POUCH RETRIEVAL ECOSAC 10MM (ENDOMECHANICALS) ×1
SCISSORS LAP 5X35 DISP (ENDOMECHANICALS) ×2 IMPLANT
SET CHOLANGIOGRAPH MIX (MISCELLANEOUS) ×1 IMPLANT
SET IRRIG TUBING LAPAROSCOPIC (IRRIGATION / IRRIGATOR) ×2 IMPLANT
SHEARS HARMONIC ACE PLUS 36CM (ENDOMECHANICALS) IMPLANT
SLEEVE XCEL OPT CAN 5 100 (ENDOMECHANICALS) ×4 IMPLANT
STOPCOCK 4 WAY LG BORE MALE ST (IV SETS) ×2 IMPLANT
SUT ETHILON 2 0 PS N (SUTURE) IMPLANT
SUT MNCRL AB 4-0 PS2 18 (SUTURE) ×2 IMPLANT
SUT VICRYL 0 ENDOLOOP (SUTURE) IMPLANT
TOWEL OR 17X26 10 PK STRL BLUE (TOWEL DISPOSABLE) ×2 IMPLANT
TOWEL OR NON WOVEN STRL DISP B (DISPOSABLE) IMPLANT
TRAY LAPAROSCOPIC (CUSTOM PROCEDURE TRAY) ×2 IMPLANT
TROCAR BLADELESS OPT 5 100 (ENDOMECHANICALS) ×2 IMPLANT
TROCAR XCEL 12X100 BLDLESS (ENDOMECHANICALS) ×2 IMPLANT
TUBING INSUF HEATED (TUBING) ×2 IMPLANT

## 2016-07-01 NOTE — Anesthesia Preprocedure Evaluation (Addendum)
Anesthesia Evaluation  Patient identified by MRN, date of birth, ID band Patient awake    Reviewed: Allergy & Precautions, NPO status , Patient's Chart, lab work & pertinent test results  History of Anesthesia Complications Negative for: history of anesthetic complications  Airway Mallampati: II  TM Distance: >3 FB Neck ROM: Full    Dental  (+) Teeth Intact, Dental Advisory Given,  Pt complains of upper lip swollen from endoscopy last week:   Pulmonary neg pulmonary ROS,    breath sounds clear to auscultation       Cardiovascular negative cardio ROS   Rhythm:Regular     Neuro/Psych negative neurological ROS  negative psych ROS   GI/Hepatic Neg liver ROS, Biliary colic   Endo/Other  Hypothyroidism   Renal/GU negative Renal ROS     Musculoskeletal   Abdominal   Peds  Hematology  (+) Blood dyscrasia, anemia ,   Anesthesia Other Findings Sore right front tooth  Reproductive/Obstetrics                            Anesthesia Physical Anesthesia Plan  ASA: II  Anesthesia Plan: General   Post-op Pain Management:    Induction: Intravenous  Airway Management Planned: Oral ETT  Additional Equipment: None  Intra-op Plan:   Post-operative Plan: Extubation in OR  Informed Consent: I have reviewed the patients History and Physical, chart, labs and discussed the procedure including the risks, benefits and alternatives for the proposed anesthesia with the patient or authorized representative who has indicated his/her understanding and acceptance.   Dental advisory given  Plan Discussed with: CRNA and Surgeon  Anesthesia Plan Comments:         Anesthesia Quick Evaluation

## 2016-07-01 NOTE — Transfer of Care (Signed)
Immediate Anesthesia Transfer of Care Note  Patient: Kristen Mendez  Procedure(s) Performed: Procedure(s): LAPAROSCOPIC CHOLECYSTECTOMY WITH INTRAOPERATIVE CHOLANGIOGRAM (N/A)  Patient Location: PACU  Anesthesia Type:General  Level of Consciousness: oriented, sedated and patient cooperative  Airway & Oxygen Therapy: Patient Spontanous Breathing and Patient connected to face mask oxygen  Post-op Assessment: Report given to RN, Post -op Vital signs reviewed and stable and Patient moving all extremities  Post vital signs: Reviewed and stable  Last Vitals:  Filed Vitals:   07/01/16 1143 07/01/16 1426  BP: 109/68 122/63  Pulse: 62 93  Temp: 36.7 C 36.7 C  Resp: 14 17    Last Pain:  Filed Vitals:   07/01/16 1430  PainSc: 3          Complications: No apparent anesthesia complications

## 2016-07-01 NOTE — Op Note (Addendum)
PATIENT:  Kristen Mendez  50 y.o. female  PRE-OPERATIVE DIAGNOSIS:  billary colic  POST-OPERATIVE DIAGNOSIS:  billary colic  PROCEDURE:  Procedure(s): LAPAROSCOPIC CHOLECYSTECTOMY WITH INTRAOPERATIVE CHOLANGIOGRAM   SURGEON:  Surgeon(s): Arta Bruce Rohn Fritsch, MD  ASSISTANT: none  ANESTHESIA:   local and general  Indications for procedure: [name] is a 50 y.o. female with symptoms of Abdominal pain, RUQ pain and Nausea consistent with gallbladder disease, Confirmed by Ultrasound. She underwent ERCP on 7/7 due to choledocholithiasis with removal of 3 stones.  Description of procedure: The patient was brought into the operative suite, placed supine. Anesthesia was administered with endotracheal tube. Patient was strapped in place and foot board was secured. All pressure points were offloaded by foam padding. The patient was prepped and draped in the usual sterile fashion.  A left subcostal incision was made and a 54mm trocar was inserted into the peritoneum under optical entry. Pneumoperitoneum was applied with high flow low pressure. A small incision was made to the right of the umbilicus and 10mm trocar inserted in this area.  2 23mm trocars were placed in the RUQ. All trocars sites were first anesthesized with 0.25% marcaine with epinephrine in the subcutaneous and preperitoneal layers. Next the patient was placed in reverse trendelenberg. There were multiple adhesions to the abdominal wall which were taken down with sharp dissection and cautery.  The gallbladder was retracted cephalad and lateral. The peritoneum was reflected off the infundibulum working lateral to medial. "The cystic duct and cystic artery were identified and further dissection revealed a critical view, due to concern for choledocholithiasis a cholangiogram was performed with ductotomy and cook catheter passed through a separate subcostal stab incision. On ductotomy, multiple stones were expressed and removed from the  abdomen. The cholangiogram was performed showing some lucencies on first run, with flushing using saline and contrast, the final run showed no lucency within the biliary tree and good flow into the duodenum, though the bile ducts were still dilated. The cystic duct and cystic artery were doubly clipped and ligated. The cystic duct was ensnared with 0 PDS endoloop because a 2nd clip did not fully encompass the duct.  The gallbladder was removed off the liver bed with cautery. The Gallbladder was placed in a specimen bag. The gallbladder fossa was irrigated and hemostasis was applied with cautery. The gallbladder was removed via the 31mm trocar. No dilation was required for removal, therefore no fascial closure was performed. Pneumoperitoneum was removed, all trocar were removed. All incisions were closed with 4-0 monocryl subcuticular stitch. The patient woke from anesthesia and was brought to PACU in stable condition. All counts were correct  Findings: multiple adhesions to abdominal wall, inflamed gallbladder, multiple stones expressed from cystic duct, small lucency on first run of IOC appeared to flush out and final run of IOC appears clear  Specimen: gallbladder  Blood loss: Total I/O In: 1000 [I.V.:1000] Out: -  ml  Local anesthesia: 20 ml 0.25% marcaine with epinephrine  Complications: none  PLAN OF CARE: Discharge to home after PACU  PATIENT DISPOSITION:  PACU - hemodynamically stable.  Gurney Maxin, M.D. General, Bariatric, & Minimally Invasive Surgery [facility]

## 2016-07-01 NOTE — Discharge Instructions (Signed)
Laparoscopic Cholecystectomy, Care After Refer to this sheet in the next few weeks. These instructions provide you with information about caring for yourself after your procedure. Your health care provider may also give you more specific instructions. Your treatment has been planned according to current medical practices, but problems sometimes occur. Call your health care provider if you have any problems or questions after your procedure. WHAT TO EXPECT AFTER THE PROCEDURE After your procedure, it is common to have:  Pain at your incision sites. You will be given pain medicines to control your pain.  Mild nausea or vomiting. This should improve after the first 24 hours.  Bloating and possible shoulder pain from the gas that was used during the procedure. This will improve after the first 24 hours. HOME CARE INSTRUCTIONS Incision Care  Follow instructions from your health care provider about how to take care of your incisions. Make sure you:  Wash your hands with soap and water before you change your bandage (dressing). If soap and water are not available, use hand sanitizer.  Change your dressing as told by your health care provider.  Leave stitches (sutures), skin glue, or adhesive strips in place. These skin closures may need to be in place for 2 weeks or longer. If adhesive strip edges start to loosen and curl up, you may trim the loose edges. Do not remove adhesive strips completely unless your health care provider tells you to do that.  Do not take baths, swim, or use a hot tub until your health care provider approves. Ask your health care provider if you can take showers. You may only be allowed to take sponge baths for bathing. General Instructions  Take over-the-counter and prescription medicines only as told by your health care provider.  Do not drive or operate heavy machinery while taking prescription pain medicine.  Return to your normal diet as told by your health care  provider.  Do not lift anything that is heavier than 10 lb (4.5 kg).  Do not play contact sports for one week or until your health care provider approves. SEEK MEDICAL CARE IF:   You have redness, swelling, or pain at the site of your incision.  You have fluid, blood, or pus coming from your incision.  You notice a bad smell coming from your incision area.  Your surgical incisions break open.  You have a fever. SEEK IMMEDIATE MEDICAL CARE IF:  You develop a rash.  You have difficulty breathing.  You have chest pain.  You have increasing pain in your shoulders (shoulder strap areas).  You faint or have dizzy episodes while you are standing.  You have severe pain in your abdomen.  You have nausea or vomiting that lasts for more than one day.   This information is not intended to replace advice given to you by your health care provider. Make sure you discuss any questions you have with your health care provider.   Document Released: 12/08/2005 Document Revised: 08/29/2015 Document Reviewed: 07/20/2013 Elsevier Interactive Patient Education 2016 [employer] Anesthesia, Adult, Care After Refer to this sheet in the next few weeks. These instructions provide you with information on caring for yourself after your procedure. Your health care provider may also give you more specific instructions. Your treatment has been planned according to current medical practices, but problems sometimes occur. Call your health care provider if you have any problems or questions after your procedure. WHAT TO EXPECT AFTER THE PROCEDURE After the procedure, it is typical  to experience:  Sleepiness.  Nausea and vomiting. HOME CARE INSTRUCTIONS  For the first 24 hours after general anesthesia:  Have a responsible person with you.  Do not drive a car. If you are alone, do not take public transportation.  Do not drink alcohol.  Do not take medicine that has not been prescribed by  your health care provider.  Do not sign important papers or make important decisions.  You may resume a normal diet and activities as directed by your health care provider.  Change bandages (dressings) as directed.  If you have questions or problems that seem related to general anesthesia, call the hospital and ask for the anesthetist or anesthesiologist on call. SEEK MEDICAL CARE IF:  You have nausea and vomiting that continue the day after anesthesia.  You develop a rash. SEEK IMMEDIATE MEDICAL CARE IF:   You have difficulty breathing.  You have chest pain.  You have any allergic problems.   This information is not intended to replace advice given to you by your health care provider. Make sure you discuss any questions you have with your health care provider.   Document Released: 03/16/2001 Document Revised: 12/29/2014 Document Reviewed: 04/07/2012 Elsevier Interactive Patient Education [employer]. .

## 2016-07-01 NOTE — Anesthesia Postprocedure Evaluation (Signed)
Anesthesia Post Note  Patient: Kristen Mendez  Procedure(s) Performed: Procedure(s) (LRB): LAPAROSCOPIC CHOLECYSTECTOMY WITH INTRAOPERATIVE CHOLANGIOGRAM (N/A)  Patient location during evaluation: PACU Anesthesia Type: General Level of consciousness: awake Pain management: pain level controlled Vital Signs Assessment: post-procedure vital signs reviewed and stable Respiratory status: spontaneous breathing Cardiovascular status: stable Postop Assessment: no signs of nausea or vomiting Anesthetic complications: no    Last Vitals:  Filed Vitals:   07/01/16 1530 07/01/16 1549  BP: 126/72 123/74  Pulse: 74 74  Temp: 36.7 C 36.7 C  Resp: 11 15    Last Pain:  Filed Vitals:   07/01/16 1550  PainSc: 0-No pain                 [name]

## 2016-07-01 NOTE — H&P (Signed)
Kristen Mendez is an 50 y.o. female.   Chief Complaint: abdominal pain HPI: 50 yo female with intermittent abdominal pain found to have choledocholithiasis. She underwent ERCP last week with retrieval of 3 stones. She has no issues over the past week and presents for laparoscopic cholecystectomy  Past Medical History  Diagnosis Date  . Thyroid disease   . Hx of acute pancreatitis 1981  . Hx gestational diabetes 1996  . SVD (spontaneous vaginal delivery)     x 1  . Anemia   . Hypothyroidism     Past Surgical History  Procedure Laterality Date  . Cesarean section  1996  . Left thyroid surgery  2015  . Laparotomy      pancreatitis  . Tubal ligation    . Laparoscopic assisted vaginal hysterectomy N/A 07/02/2015    Procedure: LAPAROSCOPIC ASSISTED VAGINAL HYSTERECTOMY;  Surgeon: Sanjuana Kava, MD;  Location: [facility];  Service: Gynecology;  Laterality: N/A;  . Laparoscopic bilateral salpingo oopherectomy Bilateral 07/02/2015    Procedure: LAPAROSCOPIC BILATERAL SALPINGO OOPHORECTOMY;  Surgeon: Sanjuana Kava, MD;  Location: [facility];  Service: Gynecology;  Laterality: Bilateral;  . Cystoscopy N/A 07/02/2015    Procedure: CYSTOSCOPY;  Surgeon: Sanjuana Kava, MD;  Location: [facility];  Service: Gynecology;  Laterality: N/A;  . Ercp N/A 06/27/2016    Procedure: ENDOSCOPIC RETROGRADE CHOLANGIOPANCREATOGRAPHY (ERCP);  Surgeon: Carol Ada, MD;  Location: [facility];  Service: Endoscopy;  Laterality: N/A;    Family History  Problem Relation Age of Onset  . Diabetes Father    Social History:  reports that she has never smoked. She has never used smokeless tobacco. She reports that she does not drink alcohol or use illicit drugs.  Allergies: No Known Allergies  Medications Prior to Admission  Medication Sig Dispense Refill  . levothyroxine (SYNTHROID, LEVOTHROID) 100 MCG tablet Take 100 mcg by mouth daily before breakfast.    . Multiple Vitamin (MULTIVITAMIN) tablet Take 1 tablet by mouth daily.    .  Omega-3 Fatty Acids (FISH OIL PO) Take 1 capsule by mouth daily.      Results for orders placed or performed during the hospital encounter of 07/01/16 (from the past 48 hour(s))  CBC WITH DIFFERENTIAL     Status: None   Collection Time: 07/01/16 11:30 AM  Result Value Ref Range   WBC 4.2 4.0 - 10.5 K/uL   RBC 4.89 3.87 - 5.11 MIL/uL   Hemoglobin 14.6 12.0 - 15.0 g/dL   HCT 42.4 36.0 - 46.0 %   MCV 86.7 78.0 - 100.0 fL   MCH 29.9 26.0 - 34.0 pg   MCHC 34.4 30.0 - 36.0 g/dL   RDW 12.9 11.5 - 15.5 %   Platelets 198 150 - 400 K/uL   Neutrophils Relative % 46 %   Neutro Abs 1.9 1.7 - 7.7 K/uL   Lymphocytes Relative 44 %   Lymphs Abs 1.8 0.7 - 4.0 K/uL   Monocytes Relative 7 %   Monocytes Absolute 0.3 0.1 - 1.0 K/uL   Eosinophils Relative 2 %   Eosinophils Absolute 0.1 0.0 - 0.7 K/uL   Basophils Relative 1 %   Basophils Absolute 0.0 0.0 - 0.1 K/uL   No results found.  Review of Systems  Constitutional: Negative for fever and chills.  HENT: Negative for hearing loss.   Eyes: Negative for blurred vision and double vision.  Respiratory: Negative for cough and hemoptysis.   Cardiovascular: Negative for chest pain and palpitations.  Gastrointestinal: Negative for nausea, vomiting  and abdominal pain.  Genitourinary: Negative for dysuria and urgency.  Musculoskeletal: Negative for myalgias and neck pain.  Skin: Negative for itching and rash.  Neurological: Negative for dizziness, tingling and headaches.  Endo/Heme/Allergies: Does not bruise/bleed easily.  Psychiatric/Behavioral: Negative for depression and suicidal ideas.    Blood pressure 109/68, pulse 62, temperature 98 F (36.7 C), temperature source Oral, resp. rate 14, height 5\' 7"  (1.702 m), weight 66.225 kg (146 lb), last menstrual period 06/10/2015, SpO2 100 %. Physical Exam  Vitals reviewed. Constitutional: She is oriented to person, place, and time. She appears well-developed and well-nourished.  HENT:  Head:  Normocephalic and atraumatic.  Eyes: Conjunctivae and EOM are normal. Pupils are equal, round, and reactive to light.  Neck: Normal range of motion. Neck supple.  Cardiovascular: Normal rate and regular rhythm.   Respiratory: Effort normal and breath sounds normal.  GI: Soft. Bowel sounds are normal. She exhibits no distension. There is no tenderness.  Well healed laparotomy scar  Musculoskeletal: Normal range of motion.  Neurological: She is alert and oriented to person, place, and time.  Skin: Skin is warm and dry.  Psychiatric: She has a normal mood and affect. Her behavior is normal.     Assessment/Plan 50 yo female with choledocholithiasis s/p ERCP. -lap chole with IOC today  Mickeal Skinner, MD 07/01/2016, 12:07 PM

## 2016-07-01 NOTE — Progress Notes (Signed)
Patient up and ambulated from 1303 to 1310. Tolerated well. To BR and voided clear amber urine. Back to bed. Complains of nausea. To be given phenergan.

## 2016-07-01 NOTE — Anesthesia Procedure Notes (Addendum)
Procedure Name: Intubation Date/Time: 07/01/2016 12:49 PM Performed by: Cynda Familia Pre-anesthesia Checklist: Patient identified, Emergency Drugs available, Patient being monitored, Timeout performed and Suction available Patient Re-evaluated:Patient Re-evaluated prior to inductionOxygen Delivery Method: Circle system utilized Preoxygenation: Pre-oxygenation with 100% oxygen Intubation Type: IV induction Ventilation: Mask ventilation without difficulty Laryngoscope Size: Miller and 2 Grade View: Grade I Tube type: Oral Tube size: 7.0 mm Number of attempts: 1 Airway Equipment and Method: Stylet Placement Confirmation: ETT inserted through vocal cords under direct vision,  positive ETCO2 and breath sounds checked- equal and bilateral Secured at: 23 cm Tube secured with: Tape Dental Injury: Teeth and Oropharynx as per pre-operative assessment  Comments: Smooth IV induction--Dr. Ermalene Postin present. Atraumatic intubation---Teeth as per pre-operative assessment. bilat BS Moser--- Elmer Sow SRNA performed intubation under supervision of DR Ermalene Postin

## 2016-07-02 ENCOUNTER — Encounter (HOSPITAL_COMMUNITY): Payer: Self-pay | Admitting: General Surgery

## 2018-01-19 ENCOUNTER — Other Ambulatory Visit: Payer: Self-pay | Admitting: Endocrinology

## 2018-01-19 DIAGNOSIS — C73 Malignant neoplasm of thyroid gland: Secondary | ICD-10-CM

## 2018-01-26 ENCOUNTER — Ambulatory Visit
Admission: RE | Admit: 2018-01-26 | Discharge: 2018-01-26 | Disposition: A | Discharge status: Home / Self Care | Payer: BLUE CROSS/BLUE SHIELD | Source: Ambulatory Visit | Attending: Endocrinology | Admitting: Endocrinology

## 2018-01-26 DIAGNOSIS — C73 Malignant neoplasm of thyroid gland: Secondary | ICD-10-CM

## 2019-01-25 DIAGNOSIS — E890 Postprocedural hypothyroidism: Secondary | ICD-10-CM | POA: Insufficient documentation

## 2019-01-25 DIAGNOSIS — C73 Malignant neoplasm of thyroid gland: Secondary | ICD-10-CM | POA: Insufficient documentation

## 2021-01-29 ENCOUNTER — Ambulatory Visit (HOSPITAL_COMMUNITY): Payer: Self-pay

## 2021-02-13 ENCOUNTER — Ambulatory Visit: Payer: BLUE CROSS/BLUE SHIELD | Admitting: Family Medicine

## 2021-02-19 ENCOUNTER — Other Ambulatory Visit: Payer: Self-pay

## 2021-02-19 ENCOUNTER — Encounter: Payer: Self-pay | Admitting: Family Medicine

## 2021-02-19 ENCOUNTER — Ambulatory Visit (INDEPENDENT_AMBULATORY_CARE_PROVIDER_SITE_OTHER): Payer: 59 | Admitting: Family Medicine

## 2021-02-19 VITALS — BP 110/76 | HR 83 | Temp 97.7°F | Ht 67.0 in | Wt 158.0 lb

## 2021-02-19 DIAGNOSIS — E2839 Other primary ovarian failure: Secondary | ICD-10-CM | POA: Diagnosis not present

## 2021-02-19 DIAGNOSIS — Z0001 Encounter for general adult medical examination with abnormal findings: Secondary | ICD-10-CM

## 2021-02-19 DIAGNOSIS — Z85850 Personal history of malignant neoplasm of thyroid: Secondary | ICD-10-CM

## 2021-02-19 DIAGNOSIS — R739 Hyperglycemia, unspecified: Secondary | ICD-10-CM | POA: Diagnosis not present

## 2021-02-19 DIAGNOSIS — Z13220 Encounter for screening for lipoid disorders: Secondary | ICD-10-CM | POA: Diagnosis not present

## 2021-02-19 DIAGNOSIS — E890 Postprocedural hypothyroidism: Secondary | ICD-10-CM | POA: Diagnosis not present

## 2021-02-19 LAB — LIPID PANEL
Cholesterol: 181 mg/dL (ref 0–200)
HDL: 54 mg/dL (ref >39.00)
LDL Cholesterol: 101 mg/dL — ABNORMAL HIGH (ref 0–99)
NonHDL: 126.66
Total CHOL/HDL Ratio: 3
Triglycerides: 128 mg/dL (ref 0.0–149.0)
VLDL: 25.6 mg/dL (ref 0.0–40.0)

## 2021-02-19 LAB — COMPREHENSIVE METABOLIC PANEL
ALT: 19 U/L (ref 0–35)
AST: 15 U/L (ref 0–37)
Albumin: 4.1 g/dL (ref 3.5–5.2)
Alkaline Phosphatase: 71 U/L (ref 39–117)
BUN: 16 mg/dL (ref 6–23)
CO2: 29 mEq/L (ref 19–32)
Calcium: 9.1 mg/dL (ref 8.4–10.5)
Chloride: 105 mEq/L (ref 96–112)
Creatinine, Ser: 0.58 mg/dL (ref 0.40–1.20)
GFR: 102.53 mL/min (ref >60.00)
Glucose, Bld: 104 mg/dL — ABNORMAL HIGH (ref 70–99)
Potassium: 4 mEq/L (ref 3.5–5.1)
Sodium: 140 mEq/L (ref 135–145)
Total Bilirubin: 0.4 mg/dL (ref 0.2–1.2)
Total Protein: 6.8 g/dL (ref 6.0–8.3)

## 2021-02-19 LAB — CBC
HCT: 37.6 % (ref 36.0–46.0)
Hemoglobin: 13.1 g/dL (ref 12.0–15.0)
MCHC: 34.8 g/dL (ref 30.0–36.0)
MCV: 88 fl (ref 78.0–100.0)
Platelets: 203 10*3/uL (ref 150.0–400.0)
RBC: 4.27 Mil/uL (ref 3.87–5.11)
RDW: 12.7 % (ref 11.5–15.5)
WBC: 4.8 10*3/uL (ref 4.0–10.5)

## 2021-02-19 LAB — T4, FREE: Free T4: 1 ng/dL (ref 0.60–1.60)

## 2021-02-19 LAB — TSH: TSH: 1.74 u[IU]/mL (ref 0.35–4.50)

## 2021-02-19 LAB — HEMOGLOBIN A1C: Hgb A1c MFr Bld: 5.8 % (ref 4.6–6.5)

## 2021-02-19 NOTE — Progress Notes (Signed)
Chief Complaint:  Kristen Mendez is a 55 y.o. female who presents today for her annual comprehensive physical exam.  She is a new patient.   Assessment/Plan:  Chronic Problems Addressed Today: History of malignant neoplasm of thyroid Plus referral to endocrinology.  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  Will check labs today and place referral to endocrinology due to her history of thyroid cancer.  Preventative Healthcare: Check labs. UTD on colon cancer screening.  She will be getting mammogram later this year.  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  Up-to-date on vaccines.  Patient Counseling(The following topics were reviewed and/or handout was given):  -Nutrition: Stressed importance of moderation in sodium/caffeine intake, saturated fat and cholesterol, caloric balance, sufficient intake of fresh fruits, vegetables, and fiber.  -Stressed the importance of regular exercise.   -Substance Abuse: Discussed cessation/primary prevention of tobacco, alcohol, or other drug use; driving or other dangerous activities under the influence; availability of treatment for abuse.   -Injury prevention: Discussed safety belts, safety helmets, smoke detector, smoking near bedding or upholstery.   -Sexuality: Discussed sexually transmitted diseases, partner selection, use of condoms, avoidance of unintended pregnancy and contraceptive alternatives.   -Dental health: Discussed importance of regular tooth brushing, flossing, and dental visits.  -Health maintenance and immunizations reviewed. Please refer to Health maintenance section.  Return to care in 1 year for next preventative visit.     Subjective:  HPI:  She has no acute complaints today. See a/p for status of chronic conditions.   Lifestyle Diet: Balanced. Plenty of fruits and vegetables.  Exercise: Likes walking.   Depression  screen PHQ 2/9 02/19/2021  Decreased Interest 0  Down, Depressed, Hopeless 0  PHQ - 2 Score 0    Health Maintenance Due  Topic Date Due  . Hepatitis C Screening  Never done  . HIV Screening  Never done  . COLONOSCOPY (Pts 45-56yrs Insurance coverage will need to be confirmed)  Never done  . MAMMOGRAM  02/25/2018     ROS: Per HPI, otherwise a complete review of systems was negative.   PMH:  The following were reviewed and entered/updated in epic: Past Medical History:  Diagnosis Date  . Anemia   . Hx gestational diabetes 1996  . Hx of acute pancreatitis 1981  . Hypothyroidism   . S/P laparoscopic assisted vaginal hysterectomy (LAVH) 07/02/2015  . SVD (spontaneous vaginal delivery)    x 1  . Thyroid disease    Patient Active Problem List   Diagnosis Date Noted  . History of malignant neoplasm of thyroid 02/19/2021  . Postoperative hypothyroidism 01/25/2019   Past Surgical History:  Procedure Laterality Date  . CESAREAN SECTION  1996  . CHOLECYSTECTOMY N/A 07/01/2016   Procedure: LAPAROSCOPIC CHOLECYSTECTOMY WITH INTRAOPERATIVE CHOLANGIOGRAM;  Surgeon: Mickeal Skinner, MD;  Location: [facility];  Service: General;  Laterality: N/A;  . CYSTOSCOPY N/A 07/02/2015   Procedure: Consuela Mimes;  Surgeon: Sanjuana Kava, MD;  Location: [facility];  Service: Gynecology;  Laterality: N/A;  . ERCP N/A 06/27/2016   Procedure: ENDOSCOPIC RETROGRADE CHOLANGIOPANCREATOGRAPHY (ERCP);  Surgeon: Carol Ada, MD;  Location: [facility];  Service: Endoscopy;  Laterality: N/A;  . LAPAROSCOPIC ASSISTED VAGINAL HYSTERECTOMY N/A 07/02/2015   Procedure: LAPAROSCOPIC ASSISTED VAGINAL HYSTERECTOMY;  Surgeon: Sanjuana Kava, MD;  Location: [facility];  Service: Gynecology;  Laterality: N/A;  . LAPAROSCOPIC BILATERAL SALPINGO OOPHERECTOMY Bilateral 07/02/2015   Procedure: LAPAROSCOPIC BILATERAL SALPINGO  OOPHORECTOMY;  Surgeon: Sanjuana Kava, MD;  Location: [facility];  Service: Gynecology;  Laterality: Bilateral;  . LAPAROTOMY      pancreatitis  . left thyroid surgery  2015  . TUBAL LIGATION      Family History  Problem Relation Age of Onset  . Diabetes Father     Medications- reviewed and updated Current Outpatient Medications  Medication Sig Dispense Refill  . Calcium Carb-Cholecalciferol (CALCIUM 500/D PO)     . levothyroxine (SYNTHROID) 112 MCG tablet levothyroxine sodium 112 mcg tabs    . Multiple Vitamin (MULTIVITAMIN) tablet Take 1 tablet by mouth daily.     No current facility-administered medications for this visit.    Allergies-reviewed and updated No Known Allergies  Social History   Socioeconomic History  . Marital status: Married    Spouse name: Not on file  . Number of children: Not on file  . Years of education: Not on file  . Highest education level: Not on file  Occupational History  . Not on file  Tobacco Use  . Smoking status: Never Smoker  . Smokeless tobacco: Never Used  Substance and Sexual Activity  . Alcohol use: No  . Drug use: No  . Sexual activity: Yes    Birth control/protection: Surgical  Other Topics Concern  . Not on file  Social History Narrative  . Not on file   Social Determinants of Health   Financial Resource Strain: Not on file  Food Insecurity: Not on file  Transportation Needs: Not on file  Physical Activity: Not on file  Stress: Not on file  Social Connections: Not on file        Objective:  Physical Exam: BP 110/76   Pulse 83   Temp 97.7 F (36.5 C) (Temporal)   Ht 5\' 7"  (1.702 m)   Wt 158 lb (71.7 kg)   LMP 06/16/2015   SpO2 98%   BMI 24.75 kg/m   Body mass index is 24.75 kg/m. Wt Readings from Last 3 Encounters:  02/19/21 158 lb (71.7 kg)  07/01/16 146 lb (66.2 kg)  06/27/16 148 lb (67.1 kg)   Gen: NAD, resting comfortably HEENT: TMs normal bilaterally. OP clear. No thyromegaly noted.  CV: RRR with no murmurs appreciated Pulm: NWOB, CTAB with no crackles, wheezes, or rhonchi GI: Normal bowel sounds present. Soft,  Nontender, Nondistended. MSK: no edema, cyanosis, or clubbing noted Skin: warm, dry Neuro: CN2-12 grossly intact. Strength 5/5 in upper and lower extremities. Reflexes symmetric and intact bilaterally.  Psych: Normal affect and thought content     Qunisha Bryk M. Jerline Pain, MD 02/19/2021 2:50 PM

## 2021-02-19 NOTE — Patient Instructions (Signed)
It was very nice to see you today!  We will check blood work today.  I will place a referral for you to see an endocrinologist.  Please get your mammogram done soon.  We will place order for bone density scan.  Hopefully your insurance will pay for this.  I will see back in a year for your next annual physical.  Please come back to see me sooner if needed.  Take care, Dr Jimmey Ralph  Please try these tips to maintain a healthy lifestyle:   Eat at least 3 REAL meals and 1-2 snacks per day.  Aim for no more than 5 hours between eating.  If you eat breakfast, please do so within one hour of getting up.    Each meal should contain half fruits/vegetables, one quarter protein, and one quarter carbs (no bigger than a computer mouse)   Cut down on sweet beverages. This includes juice, soda, and sweet tea.     Drink at least 1 glass of water with each meal and aim for at least 8 glasses per day   Exercise at least 150 minutes every week.    Preventive Care 82-30 Years Old, Female Preventive care refers to lifestyle choices and visits with your health care provider that can promote health and wellness. This includes:  A yearly physical exam. This is also called an annual wellness visit.  Regular dental and eye exams.  Immunizations.  Screening for certain conditions.  Healthy lifestyle choices, such as: ? Eating a healthy diet. ? Getting regular exercise. ? Not using drugs or products that contain nicotine and tobacco. ? Limiting alcohol use. What can I expect for my preventive care visit? Physical exam Your health care provider will check your:  Height and weight. These may be used to calculate your BMI (body mass index). BMI is a measurement that tells if you are at a healthy weight.  Heart rate and blood pressure.  Body temperature.  Skin for abnormal spots. Counseling Your health care provider may ask you questions about your:  Past medical problems.  Family's  medical history.  Alcohol, tobacco, and drug use.  Emotional well-being.  Home life and relationship well-being.  Sexual activity.  Diet, exercise, and sleep habits.  Work and work Astronomer.  Access to firearms.  Method of birth control.  Menstrual cycle.  Pregnancy history. What immunizations do I need? Vaccines are usually given at various ages, according to a schedule. Your health care provider will recommend vaccines for you based on your age, medical history, and lifestyle or other factors, such as travel or where you work.   What tests do I need? Blood tests  Lipid and cholesterol levels. These may be checked every 5 years, or more often if you are over 8 years old.  Hepatitis C test.  Hepatitis B test. Screening  Lung cancer screening. You may have this screening every year starting at age 67 if you have a 30-pack-year history of smoking and currently smoke or have quit within the past 15 years.  Colorectal cancer screening. ? All adults should have this screening starting at age 39 and continuing until age 54. ? Your health care provider may recommend screening at age 49 if you are at increased risk. ? You will have tests every 1-10 years, depending on your results and the type of screening test.  Diabetes screening. ? This is done by checking your blood sugar (glucose) after you have not eaten for a while (fasting). ? You  may have this done every 1-3 years.  Mammogram. ? This may be done every 1-2 years. ? Talk with your health care provider about when you should start having regular mammograms. This may depend on whether you have a family history of breast cancer.  BRCA-related cancer screening. This may be done if you have a family history of breast, ovarian, tubal, or peritoneal cancers.  Pelvic exam and Pap test. ? This may be done every 3 years starting at age 14. ? Starting at age 38, this may be done every 5 years if you have a Pap test in  combination with an HPV test. Other tests  STD (sexually transmitted disease) testing, if you are at risk.  Bone density scan. This is done to screen for osteoporosis. You may have this scan if you are at high risk for osteoporosis. Talk with your health care provider about your test results, treatment options, and if necessary, the need for more tests. Follow these instructions at home: Eating and drinking  Eat a diet that includes fresh fruits and vegetables, whole grains, lean protein, and low-fat dairy products.  Take vitamin and mineral supplements as recommended by your health care provider.  Do not drink alcohol if: ? Your health care provider tells you not to drink. ? You are pregnant, may be pregnant, or are planning to become pregnant.  If you drink alcohol: ? Limit how much you have to 0-1 drink a day. ? Be aware of how much alcohol is in your drink. In the [place] one drink equals one 12 oz bottle of beer (355 mL), one 5 oz glass of wine (148 mL), or one 1 oz glass of hard liquor (44 mL).   Lifestyle  Take daily care of your teeth and gums. Brush your teeth every morning and night with fluoride toothpaste. Floss one time each day.  Stay active. Exercise for at least 30 minutes 5 or more days each week.  Do not use any products that contain nicotine or tobacco, such as cigarettes, e-cigarettes, and chewing tobacco. If you need help quitting, ask your health care provider.  Do not use drugs.  If you are sexually active, practice safe sex. Use a condom or other form of protection to prevent STIs (sexually transmitted infections).  If you do not wish to become pregnant, use a form of birth control. If you plan to become pregnant, see your health care provider for a prepregnancy visit.  If told by your health care provider, take low-dose aspirin daily starting at age 43.  Find healthy ways to cope with stress, such as: ? Meditation, yoga, or listening to  music. ? Journaling. ? Talking to a trusted person. ? Spending time with friends and family. Safety  Always wear your seat belt while driving or riding in a vehicle.  Do not drive: ? If you have been drinking alcohol. Do not ride with someone who has been drinking. ? When you are tired or distracted. ? While texting.  Wear a helmet and other protective equipment during sports activities.  If you have firearms in your house, make sure you follow all gun safety procedures. What's next?  Visit your health care provider once a year for an annual wellness visit.  Ask your health care provider how often you should have your eyes and teeth checked.  Stay up to date on all vaccines. This information is not intended to replace advice given to you by your health care provider. Make sure you  discuss any questions you have with your health care provider. Document Revised: 09/11/2020 Document Reviewed: 08/19/2018 Elsevier Patient Education  2021 [employer].

## 2021-02-19 NOTE — Assessment & Plan Note (Signed)
On Synthroid 112 mcg daily.  Will check labs today and place referral to endocrinology due to her history of thyroid cancer.

## 2021-02-19 NOTE — Assessment & Plan Note (Signed)
Plus referral to endocrinology.  Status post patial thyroidectomy

## 2021-02-20 NOTE — Progress Notes (Signed)
Please inform patient of the following:  Her cholesterol and blood sugar levels are borderline. Do not need to start medications but she should continue working on diet and exercise and we can recheck in a year.   Her thyroid levels are normal. She can continue her current dose of medications.  Algis Greenhouse. Jerline Pain, MD 02/20/2021 8:25 AM

## 2021-02-25 ENCOUNTER — Telehealth: Payer: Self-pay

## 2021-02-25 NOTE — Telephone Encounter (Signed)
Called and spoke with pt and gave below message.

## 2021-02-25 NOTE — Telephone Encounter (Signed)
See below

## 2021-02-25 NOTE — Telephone Encounter (Signed)
Pt sent in appt request through mychart wanting to know if she needs a Hepatitis C screening. Please advise, so I can schedule if need

## 2021-02-25 NOTE — Telephone Encounter (Signed)
She is not at increased risk for hep C but she can come back to check or we can check with her next blood draw.  Algis Greenhouse. Jerline Pain, MD 02/25/2021 12:12 PM

## 2021-03-01 ENCOUNTER — Other Ambulatory Visit: Payer: Self-pay | Admitting: Family Medicine

## 2021-03-01 DIAGNOSIS — Z0001 Encounter for general adult medical examination with abnormal findings: Secondary | ICD-10-CM

## 2021-03-01 DIAGNOSIS — E2839 Other primary ovarian failure: Secondary | ICD-10-CM

## 2021-03-01 DIAGNOSIS — Z1231 Encounter for screening mammogram for malignant neoplasm of breast: Secondary | ICD-10-CM

## 2021-04-03 ENCOUNTER — Ambulatory Visit (INDEPENDENT_AMBULATORY_CARE_PROVIDER_SITE_OTHER): Payer: 59 | Admitting: Endocrinology

## 2021-04-03 ENCOUNTER — Encounter: Payer: Self-pay | Admitting: Endocrinology

## 2021-04-03 ENCOUNTER — Other Ambulatory Visit: Payer: Self-pay

## 2021-04-03 VITALS — BP 110/74 | HR 76 | Ht 67.0 in | Wt 153.4 lb

## 2021-04-03 DIAGNOSIS — E890 Postprocedural hypothyroidism: Secondary | ICD-10-CM

## 2021-04-03 DIAGNOSIS — Z85850 Personal history of malignant neoplasm of thyroid: Secondary | ICD-10-CM

## 2021-04-03 NOTE — Patient Instructions (Addendum)
Please continue the same levothyroxine.   Please come back for a follow-up appointment in 1 year.   

## 2021-04-03 NOTE — Progress Notes (Signed)
Subjective:    Patient ID: Kristen Mendez, female    DOB: 30-Jan-1966, 55 y.o.   MRN: [id number]  HPI Pt is ref by Dr Jerline Pain, for goiter.  She had bx of a nodule in 2014--showed FLUS.  She then had left lobect in 2015.  She takes synthroid as rx'ed.  pt states she feels well in general.  Denies neck swelling or pain.   Past Medical History:  Diagnosis Date  . Anemia   . Hx gestational diabetes 1996  . Hx of acute pancreatitis 1981  . Hypothyroidism   . S/P laparoscopic assisted vaginal hysterectomy (LAVH) 07/02/2015  . SVD (spontaneous vaginal delivery)    x 1  . Thyroid disease     Past Surgical History:  Procedure Laterality Date  . CESAREAN SECTION  1996  . CHOLECYSTECTOMY N/A 07/01/2016   Procedure: LAPAROSCOPIC CHOLECYSTECTOMY WITH INTRAOPERATIVE CHOLANGIOGRAM;  Surgeon: Mickeal Skinner, MD;  Location: [facility];  Service: General;  Laterality: N/A;  . CYSTOSCOPY N/A 07/02/2015   Procedure: Consuela Mimes;  Surgeon: Sanjuana Kava, MD;  Location: [facility];  Service: Gynecology;  Laterality: N/A;  . ERCP N/A 06/27/2016   Procedure: ENDOSCOPIC RETROGRADE CHOLANGIOPANCREATOGRAPHY (ERCP);  Surgeon: Carol Ada, MD;  Location: [facility];  Service: Endoscopy;  Laterality: N/A;  . LAPAROSCOPIC ASSISTED VAGINAL HYSTERECTOMY N/A 07/02/2015   Procedure: LAPAROSCOPIC ASSISTED VAGINAL HYSTERECTOMY;  Surgeon: Sanjuana Kava, MD;  Location: [facility];  Service: Gynecology;  Laterality: N/A;  . LAPAROSCOPIC BILATERAL SALPINGO OOPHERECTOMY Bilateral 07/02/2015   Procedure: LAPAROSCOPIC BILATERAL SALPINGO OOPHORECTOMY;  Surgeon: Sanjuana Kava, MD;  Location: [facility];  Service: Gynecology;  Laterality: Bilateral;  . LAPAROTOMY     pancreatitis  . left thyroid surgery  2015  . TUBAL LIGATION      Social History   Socioeconomic History  . Marital status: Married    Spouse name: Not on file  . Number of children: Not on file  . Years of education: Not on file  . Highest education level: Not on file   Occupational History  . Not on file  Tobacco Use  . Smoking status: Never Smoker  . Smokeless tobacco: Never Used  Substance and Sexual Activity  . Alcohol use: No  . Drug use: No  . Sexual activity: Yes    Birth control/protection: Surgical  Other Topics Concern  . Not on file  Social History Narrative  . Not on file   Social Determinants of Health   Financial Resource Strain: Not on file  Food Insecurity: Not on file  Transportation Needs: Not on file  Physical Activity: Not on file  Stress: Not on file  Social Connections: Not on file  Intimate Partner Violence: Not on file    Current Outpatient Medications on File Prior to Visit  Medication Sig Dispense Refill  . Calcium Carb-Cholecalciferol (CALCIUM 500/D PO)     . levothyroxine (SYNTHROID) 112 MCG tablet levothyroxine sodium 112 mcg tabs    . Multiple Vitamin (MULTIVITAMIN) tablet Take 1 tablet by mouth daily.     No current facility-administered medications on file prior to visit.    No Known Allergies  Family History  Problem Relation Age of Onset  . Diabetes Father   . Thyroid disease Neg Hx     BP 110/74 (BP Location: Right Arm, Patient Position: Sitting, Cuff Size: Normal)   Pulse 76   Ht $R'5\' 7"'gG$  (1.702 m)   Wt 153 lb 6.4 oz (69.6 kg)   LMP 06/16/2015   SpO2 97%  BMI 24.03 kg/m   Review of Systems     Objective:   Physical Exam VITAL SIGNS:  See vs page GENERAL: no distress Neck: a healed scar is present.  I do not appreciate a nodule in the thyroid or elsewhere in the neck.     A. 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    COMMENT:  The left thyroid lobectomy shows a small 0.5 nodular focus of  papillary microcarcinoma, follicular variant, arising in a  background of extensive lymphocytic (Hashimoto's) thyroiditis  (see  slide A5). The focus of papillary carcinoma shows nuclear  clearing, nuclear grooves and nuclear overlap. All resection  margins are negative for malignancy.    [place] (2020): 1. 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 No nodule seen. No sign of recurrence in the left thyroid bed  3. No abnormal lymph nodes seen   Lab Results  Component Value Date   TSH 1.74 02/19/2021   I have reviewed outside records, and summarized:  Pt was noted to have goiter, and referred here.  She did not have RAI, due to low stage.       Assessment & Plan:  H/o PTC.  We discussed. F/u she declines [place] this year.   Hypothyroidism: well-controlled.   Patient Instructions  Please continue the same levothyroxine.  Please come back for a follow-up appointment in 1 year.

## 2021-07-03 ENCOUNTER — Ambulatory Visit (INDEPENDENT_AMBULATORY_CARE_PROVIDER_SITE_OTHER): Payer: 59

## 2021-07-03 ENCOUNTER — Other Ambulatory Visit: Payer: Self-pay

## 2021-07-03 DIAGNOSIS — Z1231 Encounter for screening mammogram for malignant neoplasm of breast: Secondary | ICD-10-CM

## 2021-07-03 DIAGNOSIS — E2839 Other primary ovarian failure: Secondary | ICD-10-CM | POA: Diagnosis not present

## 2021-07-09 ENCOUNTER — Encounter: Payer: Self-pay | Admitting: Family Medicine

## 2021-07-09 DIAGNOSIS — M8580 Other specified disorders of bone density and structure, unspecified site: Secondary | ICD-10-CM | POA: Insufficient documentation

## 2021-07-09 NOTE — Progress Notes (Signed)
Please inform patient of the following:  Bone density scan shows mild thinning of the bones called osteopenia.  She should make sure she is getting plenty of calcium and vitamin D.  We can recheck in a couple of years.

## 2021-07-31 ENCOUNTER — Ambulatory Visit (HOSPITAL_COMMUNITY): Payer: 59

## 2021-07-31 ENCOUNTER — Ambulatory Visit (INDEPENDENT_AMBULATORY_CARE_PROVIDER_SITE_OTHER): Payer: 59 | Admitting: Physician Assistant

## 2021-07-31 ENCOUNTER — Encounter: Payer: Self-pay | Admitting: Physician Assistant

## 2021-07-31 ENCOUNTER — Other Ambulatory Visit: Payer: Self-pay

## 2021-07-31 VITALS — BP 100/70 | HR 62 | Temp 98.3°F | Ht 67.0 in | Wt 154.0 lb

## 2021-07-31 DIAGNOSIS — T161XXA Foreign body in right ear, initial encounter: Secondary | ICD-10-CM | POA: Diagnosis not present

## 2021-07-31 NOTE — Progress Notes (Addendum)
Kristen Mendez is a 55 y.o. female here for a new problem.  I acted as a Education administrator for [employer], PA-C Anselmo Pickler, LPN   History of Present Illness:   Chief Complaint  Patient presents with   Ear Pain    HPI  Ear concern Pt c/o discomfort in her right ear x 1.5 days. Denies drainage, fever, chills, cough, sore throat. At first she thought that she had a bug in her ear. Symptoms are only present when she opens and closes her jaw.  Past Medical History:  Diagnosis Date   Anemia    Hx gestational diabetes 87   Hx of acute pancreatitis 1981   Hypothyroidism    S/P laparoscopic assisted vaginal hysterectomy (LAVH) 07/02/2015   SVD (spontaneous vaginal delivery)    x 1   Thyroid disease      Social History   Tobacco Use   Smoking status: Never   Smokeless tobacco: Never  Substance Use Topics   Alcohol use: No   Drug use: No    Past Surgical History:  Procedure Laterality Date   CESAREAN SECTION  1996   CHOLECYSTECTOMY N/A 07/01/2016   Procedure: LAPAROSCOPIC CHOLECYSTECTOMY WITH INTRAOPERATIVE CHOLANGIOGRAM;  Surgeon: Mickeal Skinner, MD;  Location: [facility];  Service: General;  Laterality: N/A;   CYSTOSCOPY N/A 07/02/2015   Procedure: Consuela Mimes;  Surgeon: Sanjuana Kava, MD;  Location: [facility];  Service: Gynecology;  Laterality: N/A;   ERCP N/A 06/27/2016   Procedure: ENDOSCOPIC RETROGRADE CHOLANGIOPANCREATOGRAPHY (ERCP);  Surgeon: Carol Ada, MD;  Location: [facility];  Service: Endoscopy;  Laterality: N/A;   LAPAROSCOPIC ASSISTED VAGINAL HYSTERECTOMY N/A 07/02/2015   Procedure: LAPAROSCOPIC ASSISTED VAGINAL HYSTERECTOMY;  Surgeon: Sanjuana Kava, MD;  Location: [facility];  Service: Gynecology;  Laterality: N/A;   LAPAROSCOPIC BILATERAL SALPINGO OOPHERECTOMY Bilateral 07/02/2015   Procedure: LAPAROSCOPIC BILATERAL SALPINGO OOPHORECTOMY;  Surgeon: Sanjuana Kava, MD;  Location: [facility];  Service: Gynecology;  Laterality: Bilateral;   LAPAROTOMY     pancreatitis   left  thyroid surgery  2015   TUBAL LIGATION      Family History  Problem Relation Age of Onset   Diabetes Father    Thyroid disease Neg Hx     No Known Allergies  Current Medications:   Current Outpatient Medications:    Calcium Carb-Cholecalciferol (CALCIUM 500/D PO), , Disp: , Rfl:    levothyroxine (SYNTHROID) 112 MCG tablet, levothyroxine sodium 112 mcg tabs, Disp: , Rfl:    Multiple Vitamin (MULTIVITAMIN) tablet, Take 1 tablet by mouth daily., Disp: , Rfl:    Review of Systems:   ROS Negative unless otherwise specified per HPI.  Vitals:   Vitals:   07/31/21 1127  BP: 100/70  Pulse: 62  Temp: 98.3 F (36.8 C)  TempSrc: Temporal  SpO2: 98%  Weight: 154 lb (69.9 kg)  Height: '5\' 7"'$  (1.702 m)     Body mass index is 24.12 kg/m.  Physical Exam:   Physical Exam Vitals and nursing note reviewed.  Constitutional:      General: She is not in acute distress.    Appearance: She is well-developed. She is not ill-appearing or toxic-appearing.  HENT:     Head: Normocephalic and atraumatic.     Right Ear: Tympanic membrane and external ear normal. A foreign body (black hair) is present. Tympanic membrane is not erythematous, retracted or bulging.     Left Ear: Tympanic membrane, ear canal and external ear normal. Tympanic membrane is not erythematous, retracted or bulging.  Nose: Nose normal.     Right Sinus: No maxillary sinus tenderness or frontal sinus tenderness.     Left Sinus: No maxillary sinus tenderness or frontal sinus tenderness.     Mouth/Throat:     Pharynx: Uvula midline. No posterior oropharyngeal erythema.  Eyes:     General: Lids are normal.     Conjunctiva/sclera: Conjunctivae normal.  Neck:     Trachea: Trachea normal.  Cardiovascular:     Rate and Rhythm: Normal rate and regular rhythm.     Heart sounds: Normal heart sounds, S1 normal and S2 normal.  Pulmonary:     Effort: Pulmonary effort is normal.     Breath sounds: Normal breath sounds. No  decreased breath sounds, wheezing, rhonchi or rales.  Lymphadenopathy:     Cervical: No cervical adenopathy.  Skin:    General: Skin is warm and dry.  Neurological:     Mental Status: She is alert.  Psychiatric:        Speech: Speech normal.        Behavior: Behavior normal. Behavior is cooperative.   Diagnosis: Foreign Body - Location: R ear Procedure: Foreign body removal Type of extraction: simple Informed consent:  Discussed the risks (permanent scarring, light or dark discoloration, infection, pain, bleeding, bruising, redness, blister formation, and recurrence of the lesion) and the benefits of the procedure, as well as the alternatives.  Informed consent was obtained. Anesthesia: none The area was prepared and draped in a standard fashion. It was extracted by forceps retrieval. No laceration or blood loss. The patient tolerated the procedure well. The patient was instructed on post-op care.       Assessment and Plan:   [name] was seen today for ear pain.  Diagnoses and all orders for this visit:  Foreign body of right ear, initial encounter Black hair was removed from ear canal and patient endorsed resolution of symptoms. Follow-up as needed.  CMA or LPN served as scribe during this visit. History, Physical, and Plan performed by medical provider. The above documentation has been reviewed and is accurate and complete.   Kristen Coke, PA-C

## 2022-01-17 ENCOUNTER — Other Ambulatory Visit: Payer: Self-pay | Admitting: Family Medicine

## 2022-01-17 MED ORDER — LEVOTHYROXINE SODIUM 112 MCG PO TABS: 112.0000 ug | ORAL_TABLET | Freq: Every day | ORAL | 0 refills | Status: DC

## 2022-02-20 ENCOUNTER — Encounter: Payer: 59 | Admitting: Family Medicine

## 2022-02-21 ENCOUNTER — Encounter: Payer: Self-pay | Admitting: Family Medicine

## 2022-02-21 ENCOUNTER — Ambulatory Visit (INDEPENDENT_AMBULATORY_CARE_PROVIDER_SITE_OTHER): Payer: 59 | Admitting: Family Medicine

## 2022-02-21 ENCOUNTER — Other Ambulatory Visit: Payer: Self-pay

## 2022-02-21 VITALS — BP 107/71 | HR 72 | Temp 98.1°F | Ht 67.0 in | Wt 158.0 lb

## 2022-02-21 DIAGNOSIS — E785 Hyperlipidemia, unspecified: Secondary | ICD-10-CM | POA: Diagnosis not present

## 2022-02-21 DIAGNOSIS — E890 Postprocedural hypothyroidism: Secondary | ICD-10-CM | POA: Diagnosis not present

## 2022-02-21 DIAGNOSIS — R7303 Prediabetes: Secondary | ICD-10-CM | POA: Insufficient documentation

## 2022-02-21 DIAGNOSIS — Z0001 Encounter for general adult medical examination with abnormal findings: Secondary | ICD-10-CM | POA: Diagnosis not present

## 2022-02-21 DIAGNOSIS — M8580 Other specified disorders of bone density and structure, unspecified site: Secondary | ICD-10-CM

## 2022-02-21 DIAGNOSIS — Z85850 Personal history of malignant neoplasm of thyroid: Secondary | ICD-10-CM

## 2022-02-21 DIAGNOSIS — R739 Hyperglycemia, unspecified: Secondary | ICD-10-CM

## 2022-02-21 NOTE — Assessment & Plan Note (Signed)
We discussed calcium and vitamin D supplementation.  We can repeat DEXA next year. ?

## 2022-02-21 NOTE — Progress Notes (Signed)
? ?Chief Complaint:  ?Kristen Mendez is a 56 y.o. female who presents today for her annual comprehensive physical exam.   ? ?Assessment/Plan:  ?Chronic Problems Addressed Today: ?History of malignant neoplasm of thyroid ?She has seen endocrinology in the past.  She would like to transfer ongoing surveillance and monitoring over to [place].  Per her last endocrinology note at [facility] in 2021 was recommended that she keep her TSH in 0.5-2 range.  There was also recommendation to repeat ultrasound should she develop any signs of nodules or lymphadenopathy based on her ultrasound from 2020.  ? ?Postoperative hypothyroidism ?She is on Synthroid 112 mcg daily.  Her last endocrinology note her goal TSH is 0.5 to 2.  We will adjust the dose as needed.  She would like for [place] to continue with her ongoing care. ? ?Osteopenia last DEXA 2022 ?We discussed calcium and vitamin D supplementation.  We can repeat DEXA next year. ? ?Dyslipidemia ?Check lipids. ? ?Hyperglycemia ?Check A1c. ? ?Preventative Healthcare: ?She is up-to-date on colon cancer screening-will obtain records.  Up-to-date on mammogram.  No longer needs Pap due to history of hysterectomy.  Check labs today.  Up-to-date on vaccines. ? ?Patient Counseling(The following topics were reviewed and/or handout was given): ? -Nutrition: Stressed importance of moderation in sodium/caffeine intake, saturated fat and cholesterol, caloric balance, sufficient intake of fresh fruits, vegetables, and fiber. ? -Stressed the importance of regular exercise.  ? -Substance Abuse: Discussed cessation/primary prevention of tobacco, alcohol, or other drug use; driving or other dangerous activities under the influence; availability of treatment for abuse.  ? -Injury prevention: Discussed safety belts, safety helmets, smoke detector, smoking near bedding or upholstery.  ? -Sexuality: Discussed sexually transmitted diseases, partner selection, use of condoms, avoidance of unintended pregnancy  and contraceptive alternatives.  ? -Dental health: Discussed importance of regular tooth brushing, flossing, and dental visits. ? -Health maintenance and immunizations reviewed. Please refer to Health maintenance section. ? ?Return to care in 1 year for next preventative visit.  ? ?  ?Subjective:  ?HPI: ? ?She has no acute complaints today.  ? ?Lifestyle ?Diet: Balanced. Plenty of fruits and vegetables.  ?Exercise: Likes walking routinely.  ? ?Depression screen [facility] 2/9 02/21/2022  ?Decreased Interest 0  ?Down, Depressed, Hopeless 1  ?PHQ - 2 Score 1  ?Altered sleeping 0  ?Tired, decreased energy 0  ?Change in appetite 0  ?Feeling bad or failure about yourself  0  ?Trouble concentrating 0  ?Moving slowly or fidgety/restless 0  ?Suicidal thoughts 0  ?PHQ-9 Score 1  ?Difficult doing work/chores Not difficult at all  ? ? ?There are no preventive care reminders to display for this patient. ?  ? ?ROS: Per HPI, otherwise a complete review of systems was negative.  ? ?PMH: ? ?The following were reviewed and entered/updated in epic: ?Past Medical History:  ?Diagnosis Date  ? Anemia   ? Hx gestational diabetes 1996  ? Hx of acute pancreatitis 1981  ? Hypothyroidism   ? S/P laparoscopic assisted vaginal hysterectomy (LAVH) 07/02/2015  ? SVD (spontaneous vaginal delivery)   ? x 1  ? Thyroid disease   ? ?Patient Active Problem List  ? Diagnosis Date Noted  ? Hyperglycemia 02/21/2022  ? Dyslipidemia 02/21/2022  ? Osteopenia last DEXA 2022 07/09/2021  ? History of malignant neoplasm of thyroid 02/19/2021  ? Postoperative hypothyroidism 01/25/2019  ? ?Past Surgical History:  ?Procedure Laterality Date  ? [place]  ? CHOLECYSTECTOMY N/A 07/01/2016  ? Procedure:  LAPAROSCOPIC CHOLECYSTECTOMY WITH INTRAOPERATIVE CHOLANGIOGRAM;  Surgeon: Mickeal Skinner, MD;  Location: [facility];  Service: General;  Laterality: N/A;  ? CYSTOSCOPY N/A 07/02/2015  ? Procedure: CYSTOSCOPY;  Surgeon: Sanjuana Kava, MD;  Location: [place] [facility];  Service:  Gynecology;  Laterality: N/A;  ? ERCP N/A 06/27/2016  ? Procedure: ENDOSCOPIC RETROGRADE CHOLANGIOPANCREATOGRAPHY (ERCP);  Surgeon: Carol Ada, MD;  Location: [facility];  Service: Endoscopy;  Laterality: N/A;  ? LAPAROSCOPIC ASSISTED VAGINAL HYSTERECTOMY N/A 07/02/2015  ? Procedure: LAPAROSCOPIC ASSISTED VAGINAL HYSTERECTOMY;  Surgeon: Sanjuana Kava, MD;  Location: [place] [facility];  Service: Gynecology;  Laterality: N/A;  ? LAPAROSCOPIC BILATERAL SALPINGO OOPHERECTOMY Bilateral 07/02/2015  ? Procedure: LAPAROSCOPIC BILATERAL SALPINGO OOPHORECTOMY;  Surgeon: Sanjuana Kava, MD;  Location: [place] [facility];  Service: Gynecology;  Laterality: Bilateral;  ? LAPAROTOMY    ? pancreatitis  ? left thyroid surgery  2015  ? TUBAL LIGATION    ? ? ?Family History  ?Problem Relation Age of Onset  ? Diabetes Father   ? Thyroid disease Neg Hx   ? ? ?Medications- reviewed and updated ?Current Outpatient Medications  ?Medication Sig Dispense Refill  ? Calcium Carb-Cholecalciferol (CALCIUM 500/D PO)     ? levothyroxine (SYNTHROID) 112 MCG tablet Take 1 tablet (112 mcg total) by mouth daily before breakfast. 90 tablet 0  ? Multiple Vitamin (MULTIVITAMIN) tablet Take 1 tablet by mouth daily.    ? ?No current facility-administered medications for this visit.  ? ? ?Allergies-reviewed and updated ?No Known Allergies ? ?Social History  ? ?Socioeconomic History  ? Marital status: Married  ?  Spouse name: Not on file  ? Number of children: Not on file  ? Years of education: Not on file  ? Highest education level: Not on file  ?Occupational History  ? Not on file  ?Tobacco Use  ? Smoking status: Never  ? Smokeless tobacco: Never  ?Substance and Sexual Activity  ? Alcohol use: No  ? Drug use: No  ? Sexual activity: Yes  ?  Birth control/protection: Surgical  ?Other Topics Concern  ? Not on file  ?Social History Narrative  ? Not on file  ? ?Social Determinants of Health  ? ?Financial Resource Strain: Not on file  ?Food Insecurity: Not on file  ?Transportation Needs:  Not on file  ?Physical Activity: Not on file  ?Stress: Not on file  ?Social Connections: Not on file  ? ?   ?  ?Objective:  ?Physical Exam: ?BP 107/71 (BP Location: Left Arm)   Pulse 72   Temp 98.1 ?F (36.7 ?C) (Temporal)   Ht 5\' 7"  (1.702 m)   Wt 158 lb (71.7 kg)   LMP 06/16/2015   SpO2 98%   BMI 24.75 kg/m?   ?Body mass index is 24.75 kg/m?. ?Wt Readings from Last 3 Encounters:  ?02/21/22 158 lb (71.7 kg)  ?07/31/21 154 lb (69.9 kg)  ?04/03/21 153 lb 6.4 oz (69.6 kg)  ?Gen: NAD, resting comfortably ?HEENT: TMs normal bilaterally. OP clear. No thyromegaly noted.  ?CV: RRR with no murmurs appreciated ?Pulm: NWOB, CTAB with no crackles, wheezes, or rhonchi ?GI: Normal bowel sounds present. Soft, Nontender, Nondistended. ?MSK: no edema, cyanosis, or clubbing noted ?Skin: warm, dry ?Neuro: CN2-12 grossly intact. Strength 5/5 in upper and lower extremities. Reflexes symmetric and intact bilaterally.  ?Psych: Normal affect and thought content ?   ? ?Algis Greenhouse. Jerline Pain, MD ?02/21/2022 3:17 PM  ?

## 2022-02-21 NOTE — Assessment & Plan Note (Addendum)
She has seen endocrinology in the past.  She would like to transfer ongoing surveillance and monitoring over to [place].  Per her last endocrinology note at [facility] in 2021 was recommended that she keep her TSH in 0.5-2 range.  There was also recommendation to repeat ultrasound should she develop any signs of nodules or lymphadenopathy based on her ultrasound from 2020.  ?

## 2022-02-21 NOTE — Patient Instructions (Signed)
It was very nice to see you today! ? ?We will check blood work today. ? ?Please continue to work on diet and exercise. ? ?We can we check your bone density scan and thyroid ultrasound next year. ? ?We will get records from her most recent colonoscopy. ? ?I will see back in 1 year for your next physical.  Come back sooner if needed. ? ?Take care, ?Dr Jerline Pain ? ?PLEASE NOTE: ? ?If you had any lab tests please let us know if you have not heard back within a few days. You may see your results on mychart before we have a chance to review them but we will give you a call once they are reviewed by [place]. If we ordered any referrals today, please let us know if you have not heard from their office within the next week.  ? ?Please try these tips to maintain a healthy lifestyle: ? ?Eat at least 3 REAL meals and 1-2 snacks per day.  Aim for no more than 5 hours between eating.  If you eat breakfast, please do so within one hour of getting up.  ? ?Each meal should contain half fruits/vegetables, one quarter protein, and one quarter carbs (no bigger than a computer mouse) ? ?Cut down on sweet beverages. This includes juice, soda, and sweet tea.  ? ?Drink at least 1 glass of water with each meal and aim for at least 8 glasses per day ? ?Exercise at least 150 minutes every week.   ? ?Preventive Care 56-60 Years Old, Female ?Preventive care refers to lifestyle choices and visits with your health care provider that can promote health and wellness. Preventive care visits are also called wellness exams. ?What can I expect for my preventive care visit? ?Counseling ?Your health care provider may ask you questions about your: ?Medical history, including: ?Past medical problems. ?Family medical history. ?Pregnancy history. ?Current health, including: ?Menstrual cycle. ?Method of birth control. ?Emotional well-being. ?Home life and relationship well-being. ?Sexual activity and sexual health. ?Lifestyle, including: ?Alcohol, nicotine or  tobacco, and drug use. ?Access to firearms. ?Diet, exercise, and sleep habits. ?Work and work Statistician. ?Sunscreen use. ?Safety issues such as seatbelt and bike helmet use. ?Physical exam ?Your health care provider will check your: ?Height and weight. These may be used to calculate your BMI (body mass index). BMI is a measurement that tells if you are at a healthy weight. ?Waist circumference. This measures the distance around your waistline. This measurement also tells if you are at a healthy weight and may help predict your risk of certain diseases, such as type 2 diabetes and high blood pressure. ?Heart rate and blood pressure. ?Body temperature. ?Skin for abnormal spots. ?What immunizations do I need? ?Vaccines are usually given at various ages, according to a schedule. Your health care provider will recommend vaccines for you based on your age, medical history, and lifestyle or other factors, such as travel or where you work. ?What tests do I need? ?Screening ?Your health care provider may recommend screening tests for certain conditions. This may include: ?Lipid and cholesterol levels. ?Diabetes screening. This is done by checking your blood sugar (glucose) after you have not eaten for a while (fasting). ?Pelvic exam and Pap test. ?Hepatitis B test. ?Hepatitis C test. ?HIV (human immunodeficiency virus) test. ?STI (sexually transmitted infection) testing, if you are at risk. ?Lung cancer screening. ?Colorectal cancer screening. ?Mammogram. Talk with your health care provider about when you should start having regular mammograms. This may depend on whether  you have a family history of breast cancer. ?BRCA-related cancer screening. This may be done if you have a family history of breast, ovarian, tubal, or peritoneal cancers. ?Bone density scan. This is done to screen for osteoporosis. ?Talk with your health care provider about your test results, treatment options, and if necessary, the need for more  tests. ?Follow these instructions at home: ?Eating and drinking ? ?Eat a diet that includes fresh fruits and vegetables, whole grains, lean protein, and low-fat dairy products. ?Take vitamin and mineral supplements as recommended by your health care provider. ?Do not drink alcohol if: ?Your health care provider tells you not to drink. ?You are pregnant, may be pregnant, or are planning to become pregnant. ?If you drink alcohol: ?Limit how much you have to 0-1 drink a day. ?Know how much alcohol is in your drink. In the [place] one drink equals one 12 oz bottle of beer (355 mL), one 5 oz glass of wine (148 mL), or one 1? oz glass of hard liquor (44 mL). ?Lifestyle ?Brush your teeth every morning and night with fluoride toothpaste. Floss one time each day. ?Exercise for at least 30 minutes 5 or more days each week. ?Do not use any products that contain nicotine or tobacco. These products include cigarettes, chewing tobacco, and vaping devices, such as e-cigarettes. If you need help quitting, ask your health care provider. ?Do not use drugs. ?If you are sexually active, practice safe sex. Use a condom or other form of protection to prevent STIs. ?If you do not wish to become pregnant, use a form of birth control. If you plan to become pregnant, see your health care provider for a prepregnancy visit. ?Take aspirin only as told by your health care provider. Make sure that you understand how much to take and what form to take. Work with your health care provider to find out whether it is safe and beneficial for you to take aspirin daily. ?Find healthy ways to manage stress, such as: ?Meditation, yoga, or listening to music. ?Journaling. ?Talking to a trusted person. ?Spending time with friends and family. ?Minimize exposure to UV radiation to reduce your risk of skin cancer. ?Safety ?Always wear your seat belt while driving or riding in a vehicle. ?Do not drive: ?If you have been drinking alcohol. Do not ride with someone  who has been drinking. ?When you are tired or distracted. ?While texting. ?If you have been using any mind-altering substances or drugs. ?Wear a helmet and other protective equipment during sports activities. ?If you have firearms in your house, make sure you follow all gun safety procedures. ?Seek help if you have been physically or sexually abused. ?What's next? ?Visit your health care provider once a year for an annual wellness visit. ?Ask your health care provider how often you should have your eyes and teeth checked. ?Stay up to date on all vaccines. ?This information is not intended to replace advice given to you by your health care provider. Make sure you discuss any questions you have with your health care provider. ?Document Revised: 06/05/2021 Document Reviewed: 06/05/2021 ?Elsevier Patient Education ? [place]. ? ?

## 2022-02-21 NOTE — Assessment & Plan Note (Signed)
Check A1c. 

## 2022-02-21 NOTE — Assessment & Plan Note (Signed)
She is on Synthroid 112 mcg daily.  Her last endocrinology note her goal TSH is 0.5 to 2.  We will adjust the dose as needed.  She would like for [place] to continue with her ongoing care. ?

## 2022-02-21 NOTE — Assessment & Plan Note (Signed)
Check lipids 

## 2022-02-23 ENCOUNTER — Encounter: Payer: Self-pay | Admitting: Family Medicine

## 2022-02-24 LAB — HEMOGLOBIN A1C
Hgb A1c MFr Bld: 5.9 % of total Hgb — ABNORMAL HIGH (ref <5.7)
Mean Plasma Glucose: 123 mg/dL
eAG (mmol/L): 6.8 mmol/L

## 2022-02-24 LAB — CBC
HCT: 42.3 % (ref 35.0–45.0)
Hemoglobin: 14.2 g/dL (ref 11.7–15.5)
MCH: 29.6 pg (ref 27.0–33.0)
MCHC: 33.6 g/dL (ref 32.0–36.0)
MCV: 88.3 fL (ref 80.0–100.0)
MPV: 10.3 fL (ref 7.5–12.5)
Platelets: 241 10*3/uL (ref 140–400)
RBC: 4.79 10*6/uL (ref 3.80–5.10)
RDW: 13 % (ref 11.0–15.0)
WBC: 5.4 10*3/uL (ref 3.8–10.8)

## 2022-02-24 LAB — COMPREHENSIVE METABOLIC PANEL
AG Ratio: 1.4 (calc) (ref 1.0–2.5)
ALT: 16 U/L (ref 6–29)
AST: 19 U/L (ref 10–35)
Albumin: 4.3 g/dL (ref 3.6–5.1)
Alkaline phosphatase (APISO): 73 U/L (ref 37–153)
BUN: 17 mg/dL (ref 7–25)
CO2: 29 mmol/L (ref 20–32)
Calcium: 9.5 mg/dL (ref 8.6–10.4)
Chloride: 104 mmol/L (ref 98–110)
Creat: 0.62 mg/dL (ref 0.50–1.03)
Globulin: 3.1 g/dL (calc) (ref 1.9–3.7)
Glucose, Bld: 91 mg/dL (ref 65–99)
Potassium: 4.8 mmol/L (ref 3.5–5.3)
Sodium: 141 mmol/L (ref 135–146)
Total Bilirubin: 0.5 mg/dL (ref 0.2–1.2)
Total Protein: 7.4 g/dL (ref 6.1–8.1)

## 2022-02-24 LAB — LIPID PANEL
Cholesterol: 211 mg/dL — ABNORMAL HIGH (ref <200)
HDL: 64 mg/dL (ref >=50)
LDL Cholesterol (Calc): 127 mg/dL (calc) — ABNORMAL HIGH
Non-HDL Cholesterol (Calc): 147 mg/dL (calc) — ABNORMAL HIGH (ref <130)
Total CHOL/HDL Ratio: 3.3 (calc) (ref <5.0)
Triglycerides: 94 mg/dL (ref <150)

## 2022-02-24 LAB — SPECIMEN COMPROMISED

## 2022-02-24 LAB — T4, FREE: Free T4: 1.3 ng/dL (ref 0.8–1.8)

## 2022-02-24 LAB — TSH: TSH: 2.51 mIU/L

## 2022-02-25 NOTE — Progress Notes (Signed)
Please inform patient of the following: ? ?Cholesterol and blood sugar are borderline but stable.  She should continue working on diet and exercise. We can recheck in a year.  Everything else is at goal.  ? ?Kristen Mendez. Jerline Pain, MD ?02/25/2022 8:01 AM  ?

## 2022-04-03 ENCOUNTER — Ambulatory Visit: Payer: 59 | Admitting: Endocrinology

## 2022-04-07 ENCOUNTER — Ambulatory Visit: Payer: 59 | Admitting: Endocrinology

## 2022-05-12 ENCOUNTER — Encounter: Payer: Self-pay | Admitting: Family Medicine

## 2022-05-12 ENCOUNTER — Ambulatory Visit (INDEPENDENT_AMBULATORY_CARE_PROVIDER_SITE_OTHER): Payer: 59 | Admitting: Family Medicine

## 2022-05-12 VITALS — BP 124/80 | HR 77 | Temp 98.4°F | Ht 67.0 in

## 2022-05-12 DIAGNOSIS — Z1211 Encounter for screening for malignant neoplasm of colon: Secondary | ICD-10-CM

## 2022-05-12 DIAGNOSIS — K219 Gastro-esophageal reflux disease without esophagitis: Secondary | ICD-10-CM | POA: Diagnosis not present

## 2022-05-12 MED ORDER — PANTOPRAZOLE SODIUM 40 MG PO TBEC: 40.0000 mg | DELAYED_RELEASE_TABLET | Freq: Every day | ORAL | 3 refills | Status: DC

## 2022-05-12 NOTE — Progress Notes (Signed)
   Kristen Mendez is a 56 y.o. female who presents today for an office visit.  Assessment/Plan:  Chronic Problems Addressed Today: GERD (gastroesophageal reflux disease) No red flags.  Symptoms are not controlled with over-the-counter meds.  We will try Protonix for 4 to 6 weeks.  If not improving will need referral to GI.    Subjective:  HPI:  Patient here with concern for heart burn and reflux. This has been getting worse for a few weeks. She normally takes OTC medications which works but this did not work over the last few weeks.  She has had omeprazole in the past which worked well however she has not tried that recently.  She has mostly been using Tums.  No dysphagia.  No difficulty speaking or swallowing.  No constipation or diarrhea.       Objective:  Physical Exam: BP 124/80   Pulse 77   Temp 98.4 F (36.9 C) (Temporal)   Ht '5\' 7"'$  (1.702 m)   LMP 06/16/2015   SpO2 96%   BMI 24.75 kg/m   Gen: No acute distress, resting comfortably CV: Regular rate and rhythm with no murmurs appreciated Pulm: Normal work of breathing, clear to auscultation bilaterally with no crackles, wheezes, or rhonchi Neuro: Grossly normal, moves all extremities Psych: Normal affect and thought content      Clorissa Gruenberg M. Jerline Pain, MD 05/12/2022 2:33 PM

## 2022-05-12 NOTE — Assessment & Plan Note (Signed)
No red flags.  Symptoms are not controlled with over-the-counter meds.  We will try Protonix for 4 to 6 weeks.  If not improving will need referral to GI.

## 2022-05-12 NOTE — Patient Instructions (Signed)
It was very nice to see you today!  Please try the Protonix for a few weeks let me know if not improving.  We will refer you for your colonoscopy.  Take care, Dr Jerline Pain  PLEASE NOTE:  If you had any lab tests please let us know if you have not heard back within a few days. You may see your results on mychart before we have a chance to review them but we will give you a call once they are reviewed by [place]. If we ordered any referrals today, please let us know if you have not heard from their office within the next week.   Please try these tips to maintain a healthy lifestyle:  Eat at least 3 REAL meals and 1-2 snacks per day.  Aim for no more than 5 hours between eating.  If you eat breakfast, please do so within one hour of getting up.   Each meal should contain half fruits/vegetables, one quarter protein, and one quarter carbs (no bigger than a computer mouse)  Cut down on sweet beverages. This includes juice, soda, and sweet tea.   Drink at least 1 glass of water with each meal and aim for at least 8 glasses per day  Exercise at least 150 minutes every week.

## 2022-05-13 ENCOUNTER — Encounter: Payer: Self-pay | Admitting: Family Medicine

## 2022-05-13 ENCOUNTER — Telehealth: Payer: Self-pay

## 2022-05-13 NOTE — Telephone Encounter (Signed)
FYI [facility] called to notify Kristen Mendez that patient will continue care with Dr. Benson Norway.

## 2022-05-20 ENCOUNTER — Other Ambulatory Visit: Payer: Self-pay | Admitting: Family Medicine

## 2022-05-20 DIAGNOSIS — Z1231 Encounter for screening mammogram for malignant neoplasm of breast: Secondary | ICD-10-CM

## 2022-07-10 ENCOUNTER — Ambulatory Visit (INDEPENDENT_AMBULATORY_CARE_PROVIDER_SITE_OTHER): Payer: 59

## 2022-07-10 DIAGNOSIS — Z1231 Encounter for screening mammogram for malignant neoplasm of breast: Secondary | ICD-10-CM | POA: Diagnosis not present

## 2022-09-15 ENCOUNTER — Encounter: Payer: Self-pay | Admitting: *Deleted

## 2022-10-04 ENCOUNTER — Other Ambulatory Visit: Payer: Self-pay | Admitting: Family Medicine

## 2022-10-22 ENCOUNTER — Ambulatory Visit: Payer: Commercial Managed Care - HMO | Admitting: Family Medicine

## 2022-10-27 ENCOUNTER — Encounter: Payer: Self-pay | Admitting: Family Medicine

## 2022-10-27 ENCOUNTER — Ambulatory Visit (INDEPENDENT_AMBULATORY_CARE_PROVIDER_SITE_OTHER): Payer: Commercial Managed Care - HMO | Admitting: Family Medicine

## 2022-10-27 VITALS — BP 110/74 | HR 74 | Temp 97.8°F | Ht 67.0 in | Wt 155.6 lb

## 2022-10-27 DIAGNOSIS — K219 Gastro-esophageal reflux disease without esophagitis: Secondary | ICD-10-CM

## 2022-10-27 DIAGNOSIS — R197 Diarrhea, unspecified: Secondary | ICD-10-CM | POA: Diagnosis not present

## 2022-10-27 DIAGNOSIS — E890 Postprocedural hypothyroidism: Secondary | ICD-10-CM | POA: Diagnosis not present

## 2022-10-27 NOTE — Assessment & Plan Note (Signed)
Symptoms resolved with course of PPI.  She can continue over-the-counter meds as needed.

## 2022-10-27 NOTE — Patient Instructions (Signed)
It was very nice to see you today!  I think you may have IBS.  Please try taking over-the-counter peppermint oil.  Please increase your fiber intake.  Make sure that you are getting plenty of fluid.  We will refer you for colonoscopy.  Let me know in a few weeks how you are doing.  Take care, Dr Jerline Pain  PLEASE NOTE:  If you had any lab tests please let us know if you have not heard back within a few days. You may see your results on mychart before we have a chance to review them but we will give you a call once they are reviewed by [place]. If we ordered any referrals today, please let us know if you have not heard from their office within the next week.   Please try these tips to maintain a healthy lifestyle:  Eat at least 3 REAL meals and 1-2 snacks per day.  Aim for no more than 5 hours between eating.  If you eat breakfast, please do so within one hour of getting up.   Each meal should contain half fruits/vegetables, one quarter protein, and one quarter carbs (no bigger than a computer mouse)  Cut down on sweet beverages. This includes juice, soda, and sweet tea.   Drink at least 1 glass of water with each meal and aim for at least 8 glasses per day  Exercise at least 150 minutes every week.

## 2022-10-27 NOTE — Assessment & Plan Note (Signed)
On Synthroid 112 mcg daily.  Last TSH was at goal - doubt this is contributing to her diarrhea.

## 2022-10-27 NOTE — Progress Notes (Signed)
   Kristen Mendez is a 56 y.o. female who presents today for an office visit.  Assessment/Plan:  New/Acute Problems: Diarrhea Based on history she likely has IBS.  She is status postcholecystectomy which could be contributing as well.  Her last colonoscopy was over 5 years ago.  She does not have any red flag signs or symptoms however she is interested in colonoscopy due to rule out any other possible causes.  We will place referral today.  We discussed increasing her fiber intake and also starting peppermint oil.  She will follow with me in a few weeks via MyChart to let me know how she is doing.  Chronic Problems Addressed Today: Postoperative hypothyroidism On Synthroid 112 mcg daily.  Last TSH was at goal - doubt this is contributing to her diarrhea.  GERD (gastroesophageal reflux disease) Symptoms resolved with course of PPI.  She can continue over-the-counter meds as needed.     Subjective:  HPI:  See A/p for status of chronic conditions.  HEr main concern today is diarrhea. This has been going on for several months. It is intermittent in nature. She has noticed some low abdominal pain after having a bowel movement. This can linger for several hours.  No fevers or chills. No nausea or vomiting. No blood in stool. Gets worse with stress.        Objective:  Physical Exam: BP 110/74   Pulse 74   Temp 97.8 F (36.6 C)   Ht '5\' 7"'$  (1.702 m)   Wt 155 lb 9.6 oz (70.6 kg)   LMP 06/16/2015   SpO2 100%   BMI 24.37 kg/m   Gen: No acute distress, resting comfortably CV: Regular rate and rhythm with no murmurs appreciated Pulm: Normal work of breathing, clear to auscultation bilaterally with no crackles, wheezes, or rhonchi GI: Soft, nontender, nondistended.  Bowel sounds present. Neuro: Grossly normal, moves all extremities Psych: Normal affect and thought content      [name] M. Jerline Pain, MD 10/27/2022 8:14 AM

## 2022-12-02 LAB — HM COLONOSCOPY

## 2022-12-05 ENCOUNTER — Encounter: Payer: Self-pay | Admitting: Family Medicine

## 2022-12-22 HISTORY — PX: CATARACT EXTRACTION, BILATERAL: SHX1313

## 2023-01-04 ENCOUNTER — Other Ambulatory Visit: Payer: Self-pay | Admitting: Family Medicine

## 2023-01-12 ENCOUNTER — Encounter: Payer: Self-pay | Admitting: Family Medicine

## 2023-03-31 ENCOUNTER — Encounter: Payer: Commercial Managed Care - HMO | Admitting: Family Medicine

## 2023-04-06 ENCOUNTER — Ambulatory Visit (INDEPENDENT_AMBULATORY_CARE_PROVIDER_SITE_OTHER): Payer: Medicaid Other | Admitting: Family Medicine

## 2023-04-06 ENCOUNTER — Encounter: Payer: Self-pay | Admitting: Family Medicine

## 2023-04-06 VITALS — BP 112/68 | HR 74 | Temp 97.5°F | Ht 67.0 in | Wt 153.8 lb

## 2023-04-06 DIAGNOSIS — E785 Hyperlipidemia, unspecified: Secondary | ICD-10-CM | POA: Diagnosis not present

## 2023-04-06 DIAGNOSIS — K219 Gastro-esophageal reflux disease without esophagitis: Secondary | ICD-10-CM

## 2023-04-06 DIAGNOSIS — R739 Hyperglycemia, unspecified: Secondary | ICD-10-CM | POA: Diagnosis not present

## 2023-04-06 DIAGNOSIS — Z85850 Personal history of malignant neoplasm of thyroid: Secondary | ICD-10-CM | POA: Diagnosis not present

## 2023-04-06 DIAGNOSIS — Z114 Encounter for screening for human immunodeficiency virus [HIV]: Secondary | ICD-10-CM | POA: Diagnosis not present

## 2023-04-06 DIAGNOSIS — E890 Postprocedural hypothyroidism: Secondary | ICD-10-CM

## 2023-04-06 DIAGNOSIS — Z0001 Encounter for general adult medical examination with abnormal findings: Secondary | ICD-10-CM

## 2023-04-06 DIAGNOSIS — M8580 Other specified disorders of bone density and structure, unspecified site: Secondary | ICD-10-CM

## 2023-04-06 DIAGNOSIS — Z1159 Encounter for screening for other viral diseases: Secondary | ICD-10-CM | POA: Diagnosis not present

## 2023-04-06 LAB — TSH: TSH: 0.73 u[IU]/mL (ref 0.35–5.50)

## 2023-04-06 LAB — COMPREHENSIVE METABOLIC PANEL
ALT: 16 U/L (ref 0–35)
AST: 16 U/L (ref 0–37)
Albumin: 4.2 g/dL (ref 3.5–5.2)
Alkaline Phosphatase: 64 U/L (ref 39–117)
BUN: 19 mg/dL (ref 6–23)
CO2: 28 mEq/L (ref 19–32)
Calcium: 8.7 mg/dL (ref 8.4–10.5)
Chloride: 105 mEq/L (ref 96–112)
Creatinine, Ser: 0.51 mg/dL (ref 0.40–1.20)
GFR: 104.19 mL/min (ref >60.00)
Glucose, Bld: 112 mg/dL — ABNORMAL HIGH (ref 70–99)
Potassium: 3.9 mEq/L (ref 3.5–5.1)
Sodium: 140 mEq/L (ref 135–145)
Total Bilirubin: 0.5 mg/dL (ref 0.2–1.2)
Total Protein: 6.7 g/dL (ref 6.0–8.3)

## 2023-04-06 LAB — CBC
HCT: 33.7 % — ABNORMAL LOW (ref 36.0–46.0)
Hemoglobin: 11 g/dL — ABNORMAL LOW (ref 12.0–15.0)
MCHC: 32.5 g/dL (ref 30.0–36.0)
MCV: 73.5 fl — ABNORMAL LOW (ref 78.0–100.0)
Platelets: 245 10*3/uL (ref 150.0–400.0)
RBC: 4.58 Mil/uL (ref 3.87–5.11)
RDW: 18.5 % — ABNORMAL HIGH (ref 11.5–15.5)
WBC: 3.9 10*3/uL — ABNORMAL LOW (ref 4.0–10.5)

## 2023-04-06 LAB — LIPID PANEL
Cholesterol: 178 mg/dL (ref 0–200)
HDL: 57.9 mg/dL (ref >39.00)
LDL Cholesterol: 102 mg/dL — ABNORMAL HIGH (ref 0–99)
NonHDL: 120.59
Total CHOL/HDL Ratio: 3
Triglycerides: 91 mg/dL (ref 0.0–149.0)
VLDL: 18.2 mg/dL (ref 0.0–40.0)

## 2023-04-06 LAB — T4, FREE: Free T4: 1.19 ng/dL (ref 0.60–1.60)

## 2023-04-06 LAB — HEMOGLOBIN A1C: Hgb A1c MFr Bld: 6.4 % (ref 4.6–6.5)

## 2023-04-06 NOTE — Progress Notes (Signed)
Chief Complaint:  Kristen Mendez is a 57 y.o. female who presents today for her annual comprehensive physical exam.    Assessment/Plan:  Chronic Problems Addressed Today: Postoperative hypothyroidism Check thyroid labs.  Continue Synthroid 112 mcg daily.  Will place referral to establish with endocrinology.  Is been a couple of years since she saw them.  Osteopenia last DEXA 2022 Due for DEXA later this year.  Will place referral to endocrinology for further management.  Hyperglycemia Check A1c.  Dyslipidemia Discussed lifestyle modifications.  Check labs.  History of malignant neoplasm of thyroid Will refer to endocrinology.  Last saw them a couple of years ago.  She missed last year due to change in insurance.   Preventative Healthcare: Check labs. UTD on colon cancer screening.  Mammogram due later this year.  Up-to-date on vaccines.  Patient Counseling(The following topics were reviewed and/or handout was given):  -Nutrition: Stressed importance of moderation in sodium/caffeine intake, saturated fat and cholesterol, caloric balance, sufficient intake of fresh fruits, vegetables, and fiber.  -Stressed the importance of regular exercise.   -Substance Abuse: Discussed cessation/primary prevention of tobacco, alcohol, or other drug use; driving or other dangerous activities under the influence; availability of treatment for abuse.   -Injury prevention: Discussed safety belts, safety helmets, smoke detector, smoking near bedding or upholstery.   -Sexuality: Discussed sexually transmitted diseases, partner selection, use of condoms, avoidance of unintended pregnancy and contraceptive alternatives.   -Dental health: Discussed importance of regular tooth brushing, flossing, and dental visits.  -Health maintenance and immunizations reviewed. Please refer to Health maintenance section.  Return to care in 1 year for next preventative visit.     Subjective:  HPI:  She has no acute  complaints today. See A/p for status of chronic conditions.   Lifestyle Diet: Balanced. Plenty of fruits and vegetables.  Exercise: Trying to stay active.      04/06/2023    8:01 AM  Depression screen PHQ 2/9  Decreased Interest 0  Down, Depressed, Hopeless 0  PHQ - 2 Score 0    Health Maintenance Due  Topic Date Due   HIV Screening  Never done   Hepatitis C Screening  Never done     ROS: Per HPI, otherwise a complete review of systems was negative.   PMH:  The following were reviewed and entered/updated in epic: Past Medical History:  Diagnosis Date   Anemia    Hx gestational diabetes 54   Hx of acute pancreatitis 1981   Hypothyroidism    S/P laparoscopic assisted vaginal hysterectomy (LAVH) 07/02/2015   SVD (spontaneous vaginal delivery)    x 1   Thyroid disease    Patient Active Problem List   Diagnosis Date Noted   GERD (gastroesophageal reflux disease) 05/12/2022   Hyperglycemia 02/21/2022   Dyslipidemia 02/21/2022   Osteopenia last DEXA 2022 07/09/2021   History of malignant neoplasm of thyroid 02/19/2021   Postoperative hypothyroidism 01/25/2019   Past Surgical History:  Procedure Laterality Date   CESAREAN SECTION  1996   CHOLECYSTECTOMY N/A 07/01/2016   Procedure: LAPAROSCOPIC CHOLECYSTECTOMY WITH INTRAOPERATIVE CHOLANGIOGRAM;  Surgeon: Rodman Pickle, MD;  Location: [facility];  Service: General;  Laterality: N/A;   CYSTOSCOPY N/A 07/02/2015   Procedure: Bluford Kaufmann;  Surgeon: Essie Hart, MD;  Location: [facility];  Service: Gynecology;  Laterality: N/A;   ERCP N/A 06/27/2016   Procedure: ENDOSCOPIC RETROGRADE CHOLANGIOPANCREATOGRAPHY (ERCP);  Surgeon: Jeani Hawking, MD;  Location: [facility];  Service: Endoscopy;  Laterality: N/A;  LAPAROSCOPIC ASSISTED VAGINAL HYSTERECTOMY N/A 07/02/2015   Procedure: LAPAROSCOPIC ASSISTED VAGINAL HYSTERECTOMY;  Surgeon: Essie Hart, MD;  Location: [facility];  Service: Gynecology;  Laterality: N/A;   LAPAROSCOPIC BILATERAL  SALPINGO OOPHERECTOMY Bilateral 07/02/2015   Procedure: LAPAROSCOPIC BILATERAL SALPINGO OOPHORECTOMY;  Surgeon: Essie Hart, MD;  Location: [facility];  Service: Gynecology;  Laterality: Bilateral;   LAPAROTOMY     pancreatitis   left thyroid surgery  2015   TUBAL LIGATION      Family History  Problem Relation Age of Onset   Diabetes Father    Thyroid disease Neg Hx     Medications- reviewed and updated Current Outpatient Medications  Medication Sig Dispense Refill   Calcium Carb-Cholecalciferol (CALCIUM 500/D PO)      levothyroxine (SYNTHROID) 112 MCG tablet TAKE 1 TABLET BY MOUTH EVERY DAY BEFORE BREAKFAST 30 tablet 2   Multiple Vitamin (MULTIVITAMIN) tablet Take 1 tablet by mouth daily.     No current facility-administered medications for this visit.    Allergies-reviewed and updated No Known Allergies  Social History   Socioeconomic History   Marital status: Married    Spouse name: Not on file   Number of children: Not on file   Years of education: Not on file   Highest education level: Not on file  Occupational History   Not on file  Tobacco Use   Smoking status: Never   Smokeless tobacco: Never  Substance and Sexual Activity   Alcohol use: No   Drug use: No   Sexual activity: Yes    Birth control/protection: Surgical  Other Topics Concern   Not on file  Social History Narrative   Not on file   Social Determinants of Health   Financial Resource Strain: Not on file  Food Insecurity: Not on file  Transportation Needs: Not on file  Physical Activity: Not on file  Stress: Not on file  Social Connections: Not on file        Objective:  Physical Exam: BP 112/68   Pulse 74   Temp (!) 97.5 F (36.4 C) (Temporal)   Ht 5\' 7"  (1.702 m)   Wt 153 lb 12.8 oz (69.8 kg)   LMP 06/16/2015   SpO2 98%   BMI 24.09 kg/m   Body mass index is 24.09 kg/m. Wt Readings from Last 3 Encounters:  04/06/23 153 lb 12.8 oz (69.8 kg)  10/27/22 155 lb 9.6 oz (70.6 kg)   02/21/22 158 lb (71.7 kg)   Gen: NAD, resting comfortably HEENT: TMs normal bilaterally. OP clear. No thyromegaly noted.  CV: RRR with no murmurs appreciated Pulm: NWOB, CTAB with no crackles, wheezes, or rhonchi GI: Normal bowel sounds present. Soft, Nontender, Nondistended. MSK: no edema, cyanosis, or clubbing noted Skin: warm, dry Neuro: CN2-12 grossly intact. Strength 5/5 in upper and lower extremities. Reflexes symmetric and intact bilaterally.  Psych: Normal affect and thought content     Maleek Craver M. Jimmey Ralph, MD 04/06/2023 8:31 AM

## 2023-04-06 NOTE — Assessment & Plan Note (Signed)
Will refer to endocrinology.  Last saw them a couple of years ago.  She missed last year due to change in insurance.

## 2023-04-06 NOTE — Patient Instructions (Signed)
It was very nice to see you today!  We will check blood work today.  Please continue to work on diet and exercise.  I will see you back in a year for your next physical.  Come back sooner if needed.  Take care, Dr Jimmey Ralph  PLEASE NOTE:  If you had any lab tests, please let us know if you have not heard back within a few days. You may see your results on mychart before we have a chance to review them but we will give you a call once they are reviewed by [place].   If we ordered any referrals today, please let us know if you have not heard from their office within the next week.   If you had any urgent prescriptions sent in today, please check with the pharmacy within an hour of our visit to make sure the prescription was transmitted appropriately.   Please try these tips to maintain a healthy lifestyle:  Eat at least 3 REAL meals and 1-2 snacks per day.  Aim for no more than 5 hours between eating.  If you eat breakfast, please do so within one hour of getting up.   Each meal should contain half fruits/vegetables, one quarter protein, and one quarter carbs (no bigger than a computer mouse)  Cut down on sweet beverages. This includes juice, soda, and sweet tea.   Drink at least 1 glass of water with each meal and aim for at least 8 glasses per day  Exercise at least 150 minutes every week.    Preventive Care 52-74 Years Old, Female Preventive care refers to lifestyle choices and visits with your health care provider that can promote health and wellness. Preventive care visits are also called wellness exams. What can I expect for my preventive care visit? Counseling Your health care provider may ask you questions about your: Medical history, including: Past medical problems. Family medical history. Pregnancy history. Current health, including: Menstrual cycle. Method of birth control. Emotional well-being. Home life and relationship well-being. Sexual activity and sexual  health. Lifestyle, including: Alcohol, nicotine or tobacco, and drug use. Access to firearms. Diet, exercise, and sleep habits. Work and work Astronomer. Sunscreen use. Safety issues such as seatbelt and bike helmet use. Physical exam Your health care provider will check your: Height and weight. These may be used to calculate your BMI (body mass index). BMI is a measurement that tells if you are at a healthy weight. Waist circumference. This measures the distance around your waistline. This measurement also tells if you are at a healthy weight and may help predict your risk of certain diseases, such as type 2 diabetes and high blood pressure. Heart rate and blood pressure. Body temperature. Skin for abnormal spots. What immunizations do I need?  Vaccines are usually given at various ages, according to a schedule. Your health care provider will recommend vaccines for you based on your age, medical history, and lifestyle or other factors, such as travel or where you work. What tests do I need? Screening Your health care provider may recommend screening tests for certain conditions. This may include: Lipid and cholesterol levels. Diabetes screening. This is done by checking your blood sugar (glucose) after you have not eaten for a while (fasting). Pelvic exam and Pap test. Hepatitis B test. Hepatitis C test. HIV (human immunodeficiency virus) test. STI (sexually transmitted infection) testing, if you are at risk. Lung cancer screening. Colorectal cancer screening. Mammogram. Talk with your health care provider about when you  should start having regular mammograms. This may depend on whether you have a family history of breast cancer. BRCA-related cancer screening. This may be done if you have a family history of breast, ovarian, tubal, or peritoneal cancers. Bone density scan. This is done to screen for osteoporosis. Talk with your health care provider about your test results,  treatment options, and if necessary, the need for more tests. Follow these instructions at home: Eating and drinking  Eat a diet that includes fresh fruits and vegetables, whole grains, lean protein, and low-fat dairy products. Take vitamin and mineral supplements as recommended by your health care provider. Do not drink alcohol if: Your health care provider tells you not to drink. You are pregnant, may be pregnant, or are planning to become pregnant. If you drink alcohol: Limit how much you have to 0-1 drink a day. Know how much alcohol is in your drink. In the [place] one drink equals one 12 oz bottle of beer (355 mL), one 5 oz glass of wine (148 mL), or one 1 oz glass of hard liquor (44 mL). Lifestyle Brush your teeth every morning and night with fluoride toothpaste. Floss one time each day. Exercise for at least 30 minutes 5 or more days each week. Do not use any products that contain nicotine or tobacco. These products include cigarettes, chewing tobacco, and vaping devices, such as e-cigarettes. If you need help quitting, ask your health care provider. Do not use drugs. If you are sexually active, practice safe sex. Use a condom or other form of protection to prevent STIs. If you do not wish to become pregnant, use a form of birth control. If you plan to become pregnant, see your health care provider for a prepregnancy visit. Take aspirin only as told by your health care provider. Make sure that you understand how much to take and what form to take. Work with your health care provider to find out whether it is safe and beneficial for you to take aspirin daily. Find healthy ways to manage stress, such as: Meditation, yoga, or listening to music. Journaling. Talking to a trusted person. Spending time with friends and family. Minimize exposure to UV radiation to reduce your risk of skin cancer. Safety Always wear your seat belt while driving or riding in a vehicle. Do not drive: If you  have been drinking alcohol. Do not ride with someone who has been drinking. When you are tired or distracted. While texting. If you have been using any mind-altering substances or drugs. Wear a helmet and other protective equipment during sports activities. If you have firearms in your house, make sure you follow all gun safety procedures. Seek help if you have been physically or sexually abused. What's next? Visit your health care provider once a year for an annual wellness visit. Ask your health care provider how often you should have your eyes and teeth checked. Stay up to date on all vaccines. This information is not intended to replace advice given to you by your health care provider. Make sure you discuss any questions you have with your health care provider. Document Revised: 06/05/2021 Document Reviewed: 06/05/2021 Elsevier Patient Education  [place].

## 2023-04-06 NOTE — Assessment & Plan Note (Signed)
Check A1c. 

## 2023-04-06 NOTE — Assessment & Plan Note (Signed)
Check thyroid labs.  Continue Synthroid 112 mcg daily.  Will place referral to establish with endocrinology.  Is been a couple of years since she saw them.

## 2023-04-06 NOTE — Assessment & Plan Note (Signed)
Due for DEXA later this year.  Will place referral to endocrinology for further management.

## 2023-04-06 NOTE — Assessment & Plan Note (Signed)
Discussed lifestyle modifications.  Check labs. 

## 2023-04-07 LAB — HIV ANTIBODY (ROUTINE TESTING W REFLEX): HIV 1&2 Ab, 4th Generation: NONREACTIVE

## 2023-04-07 LAB — HEPATITIS C ANTIBODY: Hepatitis C Ab: NONREACTIVE

## 2023-04-08 ENCOUNTER — Other Ambulatory Visit: Payer: Self-pay

## 2023-04-08 DIAGNOSIS — D72819 Decreased white blood cell count, unspecified: Secondary | ICD-10-CM

## 2023-04-08 NOTE — Progress Notes (Signed)
Cholesterol and blood sugar are borderline.  Do not need to start meds for this but she should focus on diet and exercise and we can recheck these in a year.  If she wishes we could start a medication called metformin to improve her blood sugar and lower risk of progression of diabetes.  She can come in to discuss starting this if she wishes.  Her blood counts dropped quite a bit since last year.  Looks like she is probably low on iron.  Please have her come back to recheck CBC and iron panel.  I would also like to check an FOBT to make sure she does not have any signs of internal bleeding.  Please place future order for please.  The rest of her labs are all stable and we can recheck everything else in a year.

## 2023-04-17 ENCOUNTER — Other Ambulatory Visit: Payer: Medicaid Other

## 2023-04-17 DIAGNOSIS — D72819 Decreased white blood cell count, unspecified: Secondary | ICD-10-CM

## 2023-04-17 LAB — CBC WITH DIFFERENTIAL/PLATELET
Basophils Absolute: 30 cells/uL (ref 0–200)
Basophils Relative: 0.6 %
Eosinophils Absolute: 50 cells/uL (ref 15–500)
Lymphs Abs: 1950 cells/uL (ref 850–3900)
Monocytes Relative: 5.2 %
Neutrophils Relative %: 54.2 %
RBC: 4.56 10*6/uL (ref 3.80–5.10)
RDW: 16.4 % — ABNORMAL HIGH (ref 11.0–15.0)
Total Lymphocyte: 39 %

## 2023-04-17 NOTE — Addendum Note (Signed)
Addended by: Nash Shearer D on: 04/17/2023 03:34 PM   Modules accepted: Orders

## 2023-04-18 LAB — CBC WITH DIFFERENTIAL/PLATELET
Absolute Monocytes: 260 cells/uL (ref 200–950)
Eosinophils Relative: 1 %
HCT: 34.6 % — ABNORMAL LOW (ref 35.0–45.0)
Hemoglobin: 10.6 g/dL — ABNORMAL LOW (ref 11.7–15.5)
MCH: 23.2 pg — ABNORMAL LOW (ref 27.0–33.0)
MCHC: 30.6 g/dL — ABNORMAL LOW (ref 32.0–36.0)
MCV: 75.9 fL — ABNORMAL LOW (ref 80.0–100.0)
MPV: 10.3 fL (ref 7.5–12.5)
Neutro Abs: 2710 cells/uL (ref 1500–7800)
Platelets: 250 10*3/uL (ref 140–400)
WBC: 5 10*3/uL (ref 3.8–10.8)

## 2023-04-18 LAB — IRON,TIBC AND FERRITIN PANEL
%SAT: 5 % (calc) — ABNORMAL LOW (ref 16–45)
Ferritin: 2 ng/mL — ABNORMAL LOW (ref 16–232)
Iron: 18 ug/dL — ABNORMAL LOW (ref 45–160)
TIBC: 388 mcg/dL (calc) (ref 250–450)

## 2023-04-20 ENCOUNTER — Other Ambulatory Visit: Payer: Self-pay

## 2023-04-20 DIAGNOSIS — D649 Anemia, unspecified: Secondary | ICD-10-CM

## 2023-04-20 NOTE — Progress Notes (Signed)
Iron counts are low.  Can we check on the status of her FOBT?  Please make sure that she start an iron supplement ferrous sulfate 65 mg every other day on an empty stomach.  Recommend urgent referral to GI to look for any signs of internal bleeding

## 2023-04-21 NOTE — Progress Notes (Signed)
Noted. Please make sure she has something scheduled.  Kristen Mendez. Jimmey Ralph, MD 04/21/2023 7:30 AM

## 2023-04-21 NOTE — Telephone Encounter (Signed)
Patient states she wanted to be scheduled a couple of days out due to dropping off stool sample this morning. States that she will not need an interpreter. Pt has been scheduled for 04/28/23 @ 2pm.

## 2023-04-22 ENCOUNTER — Other Ambulatory Visit (INDEPENDENT_AMBULATORY_CARE_PROVIDER_SITE_OTHER): Payer: Medicaid Other

## 2023-04-22 ENCOUNTER — Other Ambulatory Visit: Payer: Self-pay

## 2023-04-22 DIAGNOSIS — D72819 Decreased white blood cell count, unspecified: Secondary | ICD-10-CM

## 2023-04-22 DIAGNOSIS — K921 Melena: Secondary | ICD-10-CM

## 2023-04-22 DIAGNOSIS — D649 Anemia, unspecified: Secondary | ICD-10-CM

## 2023-04-22 LAB — FECAL OCCULT BLOOD, IMMUNOCHEMICAL: Fecal Occult Bld: POSITIVE — AB

## 2023-04-22 NOTE — Progress Notes (Signed)
Stool sample positive for blood. Needs to see GI ASAP.  Kristen Mendez. Jimmey Ralph, MD 04/22/2023 8:29 AM

## 2023-04-23 ENCOUNTER — Encounter: Payer: Self-pay | Admitting: Family Medicine

## 2023-04-23 ENCOUNTER — Ambulatory Visit: Payer: Medicaid Other | Admitting: Family Medicine

## 2023-04-23 VITALS — BP 111/69 | HR 75 | Temp 98.2°F | Ht 67.0 in | Wt 150.0 lb

## 2023-04-23 DIAGNOSIS — E890 Postprocedural hypothyroidism: Secondary | ICD-10-CM

## 2023-04-23 DIAGNOSIS — R195 Other fecal abnormalities: Secondary | ICD-10-CM

## 2023-04-23 DIAGNOSIS — R7303 Prediabetes: Secondary | ICD-10-CM | POA: Diagnosis not present

## 2023-04-23 DIAGNOSIS — D509 Iron deficiency anemia, unspecified: Secondary | ICD-10-CM

## 2023-04-23 DIAGNOSIS — K219 Gastro-esophageal reflux disease without esophagitis: Secondary | ICD-10-CM | POA: Diagnosis not present

## 2023-04-23 NOTE — Assessment & Plan Note (Signed)
Last TSH at goal.  Continue Synthroid 112 mcg daily.

## 2023-04-23 NOTE — Progress Notes (Signed)
   Kristen Mendez is a 57 y.o. female who presents today for an office visit.  Assessment/Plan:  Chronic Problems Addressed Today: Iron deficiency anemia Had a lengthy discussion with patient regarding her recent labs.  Concern for GI blood loss given her positive FOBT though she does frequently donate blood a couple of times per year which may explain some of the drop in hgb.  It is reassuring she had a colonoscopy about 6 months ago which was normal.  However she needs complete GI workup including likely EGD with her positive FOBT.  We have referred her urgently to gastroenterology.  She will call to schedule appointment with them if she has not heard anything by next week.  She will start taking iron supplementation ferrous sulfate 65 mg every other day on an empty stomach if tolerated.  We discussed reasons return to care and seek emergent care.  Postoperative hypothyroidism Last TSH at goal.  Continue Synthroid 112 mcg daily.  Prediabetes A1c 6.4 on recent labs.  She is defer starting medications.  She will work on lifestyle modifications.  Recheck in a year.  GERD (gastroesophageal reflux disease) Has been taking Protonix 40 mg daily as needed.  She can restart this until she sees GI as above.      Subjective:  HPI:  Patient is here today for follow-up.  We saw her a couple weeks ago for annual physical.  At that time she was found to have a drop in hemoglobin to 11 from 14.2 a year prior.  This was confirmed on repeat testing and she was also shown to be iron deficient.  FOBT was positive.  She is here today to discuss those results.       Objective:  Physical Exam: BP 111/69   Pulse 75   Temp 98.2 F (36.8 C) (Temporal)   Ht 5\' 7"  (1.702 m)   Wt 150 lb (68 kg)   LMP 06/16/2015   SpO2 98%   BMI 23.49 kg/m   Gen: No acute distress, resting comfortably Neuro: Grossly normal, moves all extremities Psych: Normal affect and thought content      Wash Nienhaus M. Jimmey Ralph,  MD 04/23/2023 3:14 PM

## 2023-04-23 NOTE — Assessment & Plan Note (Signed)
Had a lengthy discussion with patient regarding her recent labs.  Concern for GI blood loss given her positive FOBT though she does frequently donate blood a couple of times per year which may explain some of the drop in hgb.  It is reassuring she had a colonoscopy about 6 months ago which was normal.  However she needs complete GI workup including likely EGD with her positive FOBT.  We have referred her urgently to gastroenterology.  She will call to schedule appointment with them if she has not heard anything by next week.  She will start taking iron supplementation ferrous sulfate 65 mg every other day on an empty stomach if tolerated.  We discussed reasons return to care and seek emergent care.

## 2023-04-23 NOTE — Assessment & Plan Note (Signed)
A1c 6.4 on recent labs.  She is defer starting medications.  She will work on lifestyle modifications.  Recheck in a year.

## 2023-04-23 NOTE — Addendum Note (Signed)
Addended by: Ardith Dark on: 04/23/2023 03:15 PM   Modules accepted: Orders

## 2023-04-23 NOTE — Patient Instructions (Signed)
It was very nice to see you today!  You have blood in your stool.  We need to have you see GI.  We will refer you urgently to Dr. Haywood Pao office.  Please start ferrous sulfate 65 mg every other day.  Please try to take on an empty stomach if possible.  Let us know if you have any change in symptoms.  No follow-ups on file.   Take care, Dr Jimmey Ralph  PLEASE NOTE:  If you had any lab tests, please let us know if you have not heard back within a few days. You may see your results on mychart before we have a chance to review them but we will give you a call once they are reviewed by [place].   If we ordered any referrals today, please let us know if you have not heard from their office within the next week.   If you had any urgent prescriptions sent in today, please check with the pharmacy within an hour of our visit to make sure the prescription was transmitted appropriately.   Please try these tips to maintain a healthy lifestyle:  Eat at least 3 REAL meals and 1-2 snacks per day.  Aim for no more than 5 hours between eating.  If you eat breakfast, please do so within one hour of getting up.   Each meal should contain half fruits/vegetables, one quarter protein, and one quarter carbs (no bigger than a computer mouse)  Cut down on sweet beverages. This includes juice, soda, and sweet tea.   Drink at least 1 glass of water with each meal and aim for at least 8 glasses per day  Exercise at least 150 minutes every week.

## 2023-04-23 NOTE — Assessment & Plan Note (Signed)
Has been taking Protonix 40 mg daily as needed.  She can restart this until she sees GI as above.

## 2023-04-24 ENCOUNTER — Other Ambulatory Visit: Payer: Self-pay | Admitting: Family Medicine

## 2023-04-27 ENCOUNTER — Encounter: Payer: Self-pay | Admitting: Family Medicine

## 2023-04-27 NOTE — Telephone Encounter (Signed)
Pt states there was a referral that was sent to [facility] but she already has a Education officer, environmental office that she goes to but the office still needs the referral to be sent there. [facility] of [address] - Phone - [phone]. - Dr Jeani Hawking

## 2023-04-28 ENCOUNTER — Other Ambulatory Visit: Payer: Self-pay | Admitting: *Deleted

## 2023-04-28 ENCOUNTER — Ambulatory Visit: Payer: Medicaid Other | Admitting: Family Medicine

## 2023-04-28 DIAGNOSIS — D509 Iron deficiency anemia, unspecified: Secondary | ICD-10-CM

## 2023-04-28 DIAGNOSIS — R195 Other fecal abnormalities: Secondary | ICD-10-CM

## 2023-04-28 NOTE — Telephone Encounter (Signed)
New referral placed.

## 2023-04-29 DIAGNOSIS — D509 Iron deficiency anemia, unspecified: Secondary | ICD-10-CM | POA: Diagnosis not present

## 2023-04-29 DIAGNOSIS — R195 Other fecal abnormalities: Secondary | ICD-10-CM | POA: Diagnosis not present

## 2023-04-29 DIAGNOSIS — K219 Gastro-esophageal reflux disease without esophagitis: Secondary | ICD-10-CM | POA: Diagnosis not present

## 2023-05-12 DIAGNOSIS — D509 Iron deficiency anemia, unspecified: Secondary | ICD-10-CM | POA: Diagnosis not present

## 2023-05-12 DIAGNOSIS — K3189 Other diseases of stomach and duodenum: Secondary | ICD-10-CM | POA: Diagnosis not present

## 2023-06-04 ENCOUNTER — Encounter (HOSPITAL_COMMUNITY)
Admission: RE | Disposition: A | Discharge status: Home / Self Care | Payer: Self-pay | Source: Home / Self Care | Attending: Gastroenterology

## 2023-06-04 ENCOUNTER — Ambulatory Visit (HOSPITAL_COMMUNITY)
Admission: RE | Admit: 2023-06-04 | Discharge: 2023-06-04 | Disposition: A | Discharge status: Home / Self Care | Payer: Medicaid Other | Attending: Gastroenterology | Admitting: Gastroenterology

## 2023-06-04 DIAGNOSIS — D509 Iron deficiency anemia, unspecified: Secondary | ICD-10-CM | POA: Insufficient documentation

## 2023-06-04 HISTORY — PX: GIVENS CAPSULE STUDY: SHX5432

## 2023-06-04 SURGERY — IMAGING PROCEDURE, GI TRACT, INTRALUMINAL, VIA CAPSULE

## 2023-06-04 SURGICAL SUPPLY — 1 items: TOWEL COTTON PACK 4EA (MISCELLANEOUS) ×4 IMPLANT

## 2023-06-05 DIAGNOSIS — D509 Iron deficiency anemia, unspecified: Secondary | ICD-10-CM | POA: Diagnosis not present

## 2023-06-05 DIAGNOSIS — K633 Ulcer of intestine: Secondary | ICD-10-CM | POA: Diagnosis not present

## 2023-06-08 ENCOUNTER — Encounter (HOSPITAL_COMMUNITY): Payer: Self-pay | Admitting: Gastroenterology

## 2023-06-26 ENCOUNTER — Other Ambulatory Visit: Payer: Self-pay | Admitting: Family Medicine

## 2023-07-16 DIAGNOSIS — H25811 Combined forms of age-related cataract, right eye: Secondary | ICD-10-CM | POA: Diagnosis not present

## 2023-07-25 ENCOUNTER — Other Ambulatory Visit: Payer: Self-pay | Admitting: Family Medicine

## 2023-07-27 ENCOUNTER — Telehealth: Payer: Self-pay | Admitting: Family Medicine

## 2023-07-27 NOTE — Telephone Encounter (Signed)
Prescription Request  07/27/2023  LOV: 04/23/2023  What is the name of the medication or equipment?  levothyroxine (SYNTHROID) 112 MCG tablet   Have you contacted your pharmacy to request a refill? No   Which pharmacy would you like this sent to?  [id number] [place], [place] - [address] AT [facility] [address] [place] [address] Phone: [phone] Fax: [phone]    Patient notified that their request is being sent to the clinical staff for review and that they should receive a response within 2 business days.   Please advise at Mobile [phone] (mobile)

## 2023-07-27 NOTE — Telephone Encounter (Signed)
[facility]  

## 2023-08-11 ENCOUNTER — Other Ambulatory Visit: Payer: Self-pay | Admitting: Family Medicine

## 2023-08-11 DIAGNOSIS — Z1231 Encounter for screening mammogram for malignant neoplasm of breast: Secondary | ICD-10-CM

## 2023-08-20 DIAGNOSIS — K219 Gastro-esophageal reflux disease without esophagitis: Secondary | ICD-10-CM | POA: Diagnosis not present

## 2023-08-20 DIAGNOSIS — D509 Iron deficiency anemia, unspecified: Secondary | ICD-10-CM | POA: Diagnosis not present

## 2023-08-20 DIAGNOSIS — R195 Other fecal abnormalities: Secondary | ICD-10-CM | POA: Diagnosis not present

## 2023-08-26 DIAGNOSIS — R197 Diarrhea, unspecified: Secondary | ICD-10-CM | POA: Diagnosis not present

## 2023-08-26 DIAGNOSIS — D509 Iron deficiency anemia, unspecified: Secondary | ICD-10-CM | POA: Diagnosis not present

## 2023-09-10 ENCOUNTER — Other Ambulatory Visit: Payer: Self-pay | Admitting: Gastroenterology

## 2023-09-10 DIAGNOSIS — H25812 Combined forms of age-related cataract, left eye: Secondary | ICD-10-CM | POA: Diagnosis not present

## 2023-09-10 DIAGNOSIS — D509 Iron deficiency anemia, unspecified: Secondary | ICD-10-CM

## 2023-09-23 ENCOUNTER — Ambulatory Visit: Payer: Medicaid Other

## 2023-09-23 DIAGNOSIS — Z1231 Encounter for screening mammogram for malignant neoplasm of breast: Secondary | ICD-10-CM

## 2023-10-02 ENCOUNTER — Other Ambulatory Visit: Payer: Medicaid Other

## 2023-10-25 ENCOUNTER — Other Ambulatory Visit: Payer: Self-pay | Admitting: Family Medicine

## 2023-10-26 ENCOUNTER — Telehealth: Payer: Self-pay | Admitting: Family Medicine

## 2023-10-26 NOTE — Telephone Encounter (Signed)
Rx was send in today

## 2023-10-26 NOTE — Telephone Encounter (Signed)
Prescription Request  10/26/2023  LOV: 04/23/2023  What is the name of the medication or equipment?  levothyroxine (SYNTHROID) 112 MCG tablet   Have you contacted your pharmacy to request a refill? Yes   Which pharmacy would you like this sent to?  [id number] [place], [place] - [address] AT [facility] [address] [place] [address] Phone: [phone] Fax: [phone]    Patient notified that their request is being sent to the clinical staff for review and that they should receive a response within 2 business days.   Please advise at Mobile [phone] (mobile)

## 2023-11-09 DIAGNOSIS — D509 Iron deficiency anemia, unspecified: Secondary | ICD-10-CM | POA: Diagnosis not present

## 2024-01-20 DIAGNOSIS — K219 Gastro-esophageal reflux disease without esophagitis: Secondary | ICD-10-CM | POA: Diagnosis not present

## 2024-01-20 DIAGNOSIS — R1031 Right lower quadrant pain: Secondary | ICD-10-CM | POA: Diagnosis not present

## 2024-01-20 DIAGNOSIS — D509 Iron deficiency anemia, unspecified: Secondary | ICD-10-CM | POA: Diagnosis not present

## 2024-01-22 ENCOUNTER — Other Ambulatory Visit: Payer: Self-pay | Admitting: Family Medicine

## 2024-01-22 ENCOUNTER — Other Ambulatory Visit: Payer: Self-pay | Admitting: Gastroenterology

## 2024-01-22 DIAGNOSIS — R1031 Right lower quadrant pain: Secondary | ICD-10-CM

## 2024-01-29 ENCOUNTER — Ambulatory Visit
Admission: RE | Admit: 2024-01-29 | Discharge: 2024-01-29 | Disposition: A | Discharge status: Home / Self Care | Payer: Medicaid Other | Source: Ambulatory Visit | Attending: Gastroenterology | Admitting: Gastroenterology

## 2024-01-29 DIAGNOSIS — R1031 Right lower quadrant pain: Secondary | ICD-10-CM

## 2024-01-29 MED ORDER — IOPAMIDOL (ISOVUE-300) INJECTION 61%
100.0000 mL | Freq: Once | INTRAVENOUS | Status: AC | PRN
  Administered 2024-01-29: 100 mL via INTRAVENOUS

## 2024-02-10 ENCOUNTER — Ambulatory Visit: Payer: Medicaid Other | Admitting: Family Medicine

## 2024-02-23 ENCOUNTER — Ambulatory Visit: Payer: Medicaid Other | Admitting: Family Medicine

## 2024-02-23 VITALS — BP 108/71 | HR 68 | Temp 97.5°F | Ht 67.0 in | Wt 152.6 lb

## 2024-02-23 DIAGNOSIS — D509 Iron deficiency anemia, unspecified: Secondary | ICD-10-CM

## 2024-02-23 DIAGNOSIS — Z85850 Personal history of malignant neoplasm of thyroid: Secondary | ICD-10-CM | POA: Diagnosis not present

## 2024-02-23 DIAGNOSIS — E890 Postprocedural hypothyroidism: Secondary | ICD-10-CM | POA: Diagnosis not present

## 2024-02-23 DIAGNOSIS — K633 Ulcer of intestine: Secondary | ICD-10-CM | POA: Diagnosis not present

## 2024-02-23 DIAGNOSIS — R7303 Prediabetes: Secondary | ICD-10-CM | POA: Diagnosis not present

## 2024-02-23 DIAGNOSIS — M542 Cervicalgia: Secondary | ICD-10-CM | POA: Diagnosis not present

## 2024-02-23 DIAGNOSIS — E785 Hyperlipidemia, unspecified: Secondary | ICD-10-CM | POA: Diagnosis not present

## 2024-02-23 NOTE — Assessment & Plan Note (Signed)
 Found to have small bowel ulcer via capsule endoscopy.  We can recheck labs when she comes back in for CPE.

## 2024-02-23 NOTE — Assessment & Plan Note (Signed)
 Check lipids at Comprehensive Physical Exam (CPE) preventive care annual visit.

## 2024-02-23 NOTE — Assessment & Plan Note (Signed)
 Continue Synthroid 112 mcg daily.  Will check TSH when she comes back in for her CPE.

## 2024-02-23 NOTE — Assessment & Plan Note (Signed)
 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  She has been following with them periodically since then.  She is currently on iron supplementation.  Will recheck labs when she comes back in for physical next month.

## 2024-02-23 NOTE — Patient Instructions (Signed)
 It was very nice to see you today!  I think your pain is probably coming from the muscle in your neck.  We will check an ultrasound to make sure there is nothing else is going on.  Will see you back next month for your physical.  Please let us know if you have any change in symptoms.  Return for Annual Physical.   Take care, Dr Jimmey Ralph  PLEASE NOTE:  If you had any lab tests, please let us know if you have not heard back within a few days. You may see your results on mychart before we have a chance to review them but we will give you a call once they are reviewed by [place].   If we ordered any referrals today, please let us know if you have not heard from their office within the next week.   If you had any urgent prescriptions sent in today, please check with the pharmacy within an hour of our visit to make sure the prescription was transmitted appropriately.   Please try these tips to maintain a healthy lifestyle:  Eat at least 3 REAL meals and 1-2 snacks per day.  Aim for no more than 5 hours between eating.  If you eat breakfast, please do so within one hour of getting up.   Each meal should contain half fruits/vegetables, one quarter protein, and one quarter carbs (no bigger than a computer mouse)  Cut down on sweet beverages. This includes juice, soda, and sweet tea.   Drink at least 1 glass of water with each meal and aim for at least 8 glasses per day  Exercise at least 150 minutes every week.

## 2024-02-23 NOTE — Assessment & Plan Note (Signed)
 We are checking ultrasound as above.

## 2024-02-23 NOTE — Progress Notes (Signed)
   Kristen Mendez is a 58 y.o. female who presents today for an office visit.  Assessment/Plan:  New/Acute Problems: Neck Pain  Pain localized to distal part of her right SCM and exacerbated by rotation-likely this is musculoskeletal however given her history of thyroid cancer status postresection would be reasonable for [place] to check ultrasound at this point to rule out other possible causes especially given that this has been going on for several months.  We did discuss trial of anti-inflammatory however she declined.  Also discussed referral to PT however she declined.  Chronic Problems Addressed Today: History of malignant neoplasm of thyroid We are checking ultrasound as above.  Postoperative hypothyroidism Continue Synthroid 112 mcg daily.  Will check TSH when she comes back in for her CPE.  Prediabetes Check A1c at Comprehensive Physical Exam (CPE) preventive care annual visit.   Dyslipidemia Check lipids at Comprehensive Physical Exam (CPE) preventive care annual visit.   Iron deficiency anemia Found to have small bowel ulcer via capsule endoscopy.  We can recheck labs when she comes back in for CPE.  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  She has been following with them periodically since then.  She is currently on iron supplementation.  Will recheck labs when she comes back in for physical next month.     Subjective:  HPI:  See Assessment / plan for status of chronic conditions. Patient here with neck pain on right side of her neck. This has been going the last several months but is worse in the last few weeks. No injuries or obvious precipitating events. Worse when turning neck to the right. No treatments tried. No lumps or bumps. Getting worse the last few weeks.   Since her last visit she has also been following with GI for positive FOBT.  Underwent full evaluation and was found to have small intestinal ulcer via capsule  endoscopy.       Objective:  Physical Exam: BP 108/71   Pulse 68   Temp (!) 97.5 F (36.4 C) (Temporal)   Ht 5\' 7"  (1.702 m)   Wt 152 lb 9.6 oz (69.2 kg)   LMP 06/16/2015   SpO2 99%   BMI 23.90 kg/m   Gen: No acute distress, resting comfortably HEENT: No obvious deformities noted.  No appreciable thyromegaly or thyroid nodules.  Pain noted along distal aspect of right SCM.  Worsened with rotation of head. CV: Regular rate and rhythm with no murmurs appreciated Pulm: Normal work of breathing, clear to auscultation bilaterally with no crackles, wheezes, or rhonchi Neuro: Grossly normal, moves all extremities Psych: Normal affect and thought content      Windsor Goeken M. Jimmey Ralph, MD 02/23/2024 1:59 PM

## 2024-02-23 NOTE — Assessment & Plan Note (Signed)
 Check A1c at Comprehensive Physical Exam (CPE) preventive care annual visit.

## 2024-03-07 ENCOUNTER — Ambulatory Visit (HOSPITAL_BASED_OUTPATIENT_CLINIC_OR_DEPARTMENT_OTHER)
Admission: RE | Admit: 2024-03-07 | Discharge: 2024-03-07 | Disposition: A | Discharge status: Home / Self Care | Source: Ambulatory Visit | Attending: Family Medicine | Admitting: Family Medicine

## 2024-03-07 DIAGNOSIS — E049 Nontoxic goiter, unspecified: Secondary | ICD-10-CM | POA: Diagnosis not present

## 2024-03-07 DIAGNOSIS — M542 Cervicalgia: Secondary | ICD-10-CM | POA: Diagnosis not present

## 2024-03-07 DIAGNOSIS — E890 Postprocedural hypothyroidism: Secondary | ICD-10-CM | POA: Diagnosis not present

## 2024-03-07 DIAGNOSIS — Z85850 Personal history of malignant neoplasm of thyroid: Secondary | ICD-10-CM | POA: Insufficient documentation

## 2024-03-14 ENCOUNTER — Encounter: Payer: Self-pay | Admitting: Family Medicine

## 2024-03-14 ENCOUNTER — Other Ambulatory Visit: Payer: Self-pay | Admitting: Family Medicine

## 2024-03-14 NOTE — Telephone Encounter (Signed)
 Copied from CRM [id number]. Topic: Clinical - Medication Refill >> Mar 14, 2024  8:03 AM Benetta Spar A wrote: Most Recent Primary Care Visit:  Provider: Ardith Dark  Department: [facility]  Visit Type: OFFICE VISIT  Date: 02/23/2024  Medication: levothyroxine (SYNTHROID) 112 MCG tablet  Has the patient contacted their pharmacy? No (Agent: If no, request that the patient contact the pharmacy for the refill. If patient does not wish to contact the pharmacy document the reason why and proceed with request.) (Agent: If yes, when and what did the pharmacy advise?)  Is this the correct pharmacy for this prescription? Yes If no, delete pharmacy and type the correct one.  This is the patient's preferred pharmacy:  [id number] [place], [place] - [address] AT [facility] [address] [place] [address] Phone: [phone] Fax: [phone]   Has the prescription been filled recently? No  Is the patient out of the medication? Yes  Has the patient been seen for an appointment in the last year OR does the patient have an upcoming appointment? Yes  Can we respond through MyChart? Yes  Agent: Please be advised that Rx refills may take up to 3 business days. We ask that you follow-up with your pharmacy.

## 2024-03-14 NOTE — Progress Notes (Signed)
 Her thyroid ultrasound was stable compared to the last one from 2019 no new nodules or other abnormalities.  We do not need to do any other testing at this point however she should let us know if her pain is not improving.

## 2024-03-15 MED ORDER — LEVOTHYROXINE SODIUM 112 MCG PO TABS: 112.0000 ug | ORAL_TABLET | Freq: Every day | ORAL | 2 refills | Status: DC

## 2024-04-11 ENCOUNTER — Ambulatory Visit (INDEPENDENT_AMBULATORY_CARE_PROVIDER_SITE_OTHER): Payer: Medicaid Other | Admitting: Family Medicine

## 2024-04-11 ENCOUNTER — Encounter: Payer: Self-pay | Admitting: Family Medicine

## 2024-04-11 VITALS — BP 107/74 | HR 64 | Temp 97.5°F | Ht 67.0 in | Wt 152.0 lb

## 2024-04-11 DIAGNOSIS — Z0000 Encounter for general adult medical examination without abnormal findings: Secondary | ICD-10-CM

## 2024-04-11 DIAGNOSIS — E785 Hyperlipidemia, unspecified: Secondary | ICD-10-CM

## 2024-04-11 DIAGNOSIS — Z85850 Personal history of malignant neoplasm of thyroid: Secondary | ICD-10-CM | POA: Diagnosis not present

## 2024-04-11 DIAGNOSIS — R7303 Prediabetes: Secondary | ICD-10-CM | POA: Diagnosis not present

## 2024-04-11 DIAGNOSIS — E890 Postprocedural hypothyroidism: Secondary | ICD-10-CM

## 2024-04-11 DIAGNOSIS — K219 Gastro-esophageal reflux disease without esophagitis: Secondary | ICD-10-CM | POA: Diagnosis not present

## 2024-04-11 DIAGNOSIS — D509 Iron deficiency anemia, unspecified: Secondary | ICD-10-CM | POA: Diagnosis not present

## 2024-04-11 DIAGNOSIS — K633 Ulcer of intestine: Secondary | ICD-10-CM

## 2024-04-11 DIAGNOSIS — Z0001 Encounter for general adult medical examination with abnormal findings: Secondary | ICD-10-CM

## 2024-04-11 LAB — CBC WITH DIFFERENTIAL/PLATELET
Basophils Absolute: 0 10*3/uL (ref 0.0–0.1)
Basophils Relative: 0.7 % (ref 0.0–3.0)
Eosinophils Absolute: 0.1 10*3/uL (ref 0.0–0.7)
Eosinophils Relative: 1.5 % (ref 0.0–5.0)
HCT: 41.3 % (ref 36.0–46.0)
Hemoglobin: 14 g/dL (ref 12.0–15.0)
Lymphocytes Relative: 33.4 % (ref 12.0–46.0)
Lymphs Abs: 1.2 10*3/uL (ref 0.7–4.0)
MCHC: 34 g/dL (ref 30.0–36.0)
MCV: 90.3 fl (ref 78.0–100.0)
Monocytes Absolute: 0.2 10*3/uL (ref 0.1–1.0)
Monocytes Relative: 4.3 % (ref 3.0–12.0)
Neutro Abs: 2.2 10*3/uL (ref 1.4–7.7)
Neutrophils Relative %: 60.1 % (ref 43.0–77.0)
Platelets: 206 10*3/uL (ref 150.0–400.0)
RBC: 4.58 Mil/uL (ref 3.87–5.11)
RDW: 13.4 % (ref 11.5–15.5)
WBC: 3.7 10*3/uL — ABNORMAL LOW (ref 4.0–10.5)

## 2024-04-11 LAB — COMPREHENSIVE METABOLIC PANEL WITH GFR
ALT: 20 U/L (ref 0–35)
AST: 19 U/L (ref 0–37)
Albumin: 4.4 g/dL (ref 3.5–5.2)
Alkaline Phosphatase: 58 U/L (ref 39–117)
BUN: 15 mg/dL (ref 6–23)
CO2: 30 meq/L (ref 19–32)
Calcium: 9 mg/dL (ref 8.4–10.5)
Chloride: 105 meq/L (ref 96–112)
Creatinine, Ser: 0.59 mg/dL (ref 0.40–1.20)
GFR: 99.88 mL/min (ref >60.00)
Glucose, Bld: 117 mg/dL — ABNORMAL HIGH (ref 70–99)
Potassium: 3.9 meq/L (ref 3.5–5.1)
Sodium: 141 meq/L (ref 135–145)
Total Bilirubin: 0.7 mg/dL (ref 0.2–1.2)
Total Protein: 6.8 g/dL (ref 6.0–8.3)

## 2024-04-11 LAB — IBC + FERRITIN
Ferritin: 9.2 ng/mL — ABNORMAL LOW (ref 10.0–291.0)
Iron: 97 ug/dL (ref 42–145)
Saturation Ratios: 28.4 % (ref 20.0–50.0)
TIBC: 341.6 ug/dL (ref 250.0–450.0)
Transferrin: 244 mg/dL (ref 212.0–360.0)

## 2024-04-11 LAB — VITAMIN B12: Vitamin B-12: 189 pg/mL — ABNORMAL LOW (ref 211–911)

## 2024-04-11 LAB — HEMOGLOBIN A1C: Hgb A1c MFr Bld: 5.9 % (ref 4.6–6.5)

## 2024-04-11 LAB — LIPID PANEL
Cholesterol: 184 mg/dL (ref 0–200)
HDL: 58.3 mg/dL (ref >39.00)
LDL Cholesterol: 109 mg/dL — ABNORMAL HIGH (ref 0–99)
NonHDL: 125.68
Total CHOL/HDL Ratio: 3
Triglycerides: 84 mg/dL (ref 0.0–149.0)
VLDL: 16.8 mg/dL (ref 0.0–40.0)

## 2024-04-11 LAB — VITAMIN D 25 HYDROXY (VIT D DEFICIENCY, FRACTURES): VITD: 36.26 ng/mL (ref 30.00–100.00)

## 2024-04-11 LAB — TSH: TSH: 1.67 u[IU]/mL (ref 0.35–5.50)

## 2024-04-11 NOTE — Assessment & Plan Note (Signed)
 Diagnosed with small bowel ulcer by capsule endoscopy a few months ago.  She will follow-up with GI as directed.  Check labs today including iron panel and CBC.

## 2024-04-11 NOTE — Progress Notes (Signed)
 Chief Complaint:  Kristen Mendez is a 58 y.o. female who presents today for her annual comprehensive physical exam.    Assessment/Plan:  Chronic Problems Addressed Today: Postoperative hypothyroidism On Synthroid  112 mcg daily.  Check TSH.  Prediabetes Check A1c.  Dyslipidemia Check lipids.  GERD (gastroesophageal reflux disease) On Protonix  40 mg daily as needed per GI.  Check B12.  Iron deficiency anemia Diagnosed with small bowel ulcer by capsule endoscopy a few months ago.  She will follow-up with GI as directed.  Check labs today including iron panel and CBC.  Ulcer of small intestine Continue management per GI.  History of malignant neoplasm of thyroid  Recent ultrasound showed stable appearance compared to 2019.  No further testing indicated at this time.  Preventative Healthcare: Check labs.  Up-to-date on vaccines.  Patient Counseling(The following topics were reviewed and/or handout was given):  -Nutrition: Stressed importance of moderation in sodium/caffeine intake, saturated fat and cholesterol, caloric balance, sufficient intake of fresh fruits, vegetables, and fiber.  -Stressed the importance of regular exercise.   -Substance Abuse: Discussed cessation/primary prevention of tobacco, alcohol, or other drug use; driving or other dangerous activities under the influence; availability of treatment for abuse.   -Injury prevention: Discussed safety belts, safety helmets, smoke detector, smoking near bedding or upholstery.   -Sexuality: Discussed sexually transmitted diseases, partner selection, use of condoms, avoidance of unintended pregnancy and contraceptive alternatives.   -Dental health: Discussed importance of regular tooth brushing, flossing, and dental visits.  -Health maintenance and immunizations reviewed. Please refer to Health maintenance section.  Return to care in 1 year for next preventative visit.     Subjective:  HPI:  She has no acute complaints  today.   Lifestyle Diet: Reducing sugar intake.  Exercise: Walking more. Trying for at least once per week.      04/11/2024    7:53 AM  Depression screen PHQ 2/9  Decreased Interest 0  Down, Depressed, Hopeless 0  PHQ - 2 Score 0   ROS: Per HPI, otherwise a complete review of systems was negative.   PMH:  The following were reviewed and entered/updated in epic: Past Medical History:  Diagnosis Date   Anemia    Hx gestational diabetes 22   Hx of acute pancreatitis 1981   Hypothyroidism    S/P laparoscopic assisted vaginal hysterectomy (LAVH) 07/02/2015   SVD (spontaneous vaginal delivery)    x 1   Thyroid  disease    Patient Active Problem List   Diagnosis Date Noted   Ulcer of small intestine 02/23/2024   Iron deficiency anemia 04/23/2023   GERD (gastroesophageal reflux disease) 05/12/2022   Prediabetes 02/21/2022   Dyslipidemia 02/21/2022   Osteopenia last DEXA 2022 07/09/2021   History of malignant neoplasm of thyroid  02/19/2021   Postoperative hypothyroidism 01/25/2019   Past Surgical History:  Procedure Laterality Date   CATARACT EXTRACTION, BILATERAL Bilateral 2024   CESAREAN SECTION  12/22/1994   CHOLECYSTECTOMY N/A 07/01/2016   Procedure: LAPAROSCOPIC CHOLECYSTECTOMY WITH INTRAOPERATIVE CHOLANGIOGRAM;  Surgeon: Derral Flick, MD;  Location: [facility];  Service: General;  Laterality: N/A;   CYSTOSCOPY N/A 07/02/2015   Procedure: Orin Birk;  Surgeon: Artemisa Bile, MD;  Location: [facility];  Service: Gynecology;  Laterality: N/A;   ERCP N/A 06/27/2016   Procedure: ENDOSCOPIC RETROGRADE CHOLANGIOPANCREATOGRAPHY (ERCP);  Surgeon: Alvis Jourdain, MD;  Location: [facility];  Service: Endoscopy;  Laterality: N/A;   GIVENS CAPSULE STUDY N/A 06/04/2023   Procedure: GIVENS CAPSULE STUDY;  Surgeon: Alvis Jourdain, MD;  Location: [facility];  Service: Gastroenterology;  Laterality: N/A;   LAPAROSCOPIC ASSISTED VAGINAL HYSTERECTOMY N/A 07/02/2015   Procedure:  LAPAROSCOPIC ASSISTED VAGINAL HYSTERECTOMY;  Surgeon: Artemisa Bile, MD;  Location: [facility];  Service: Gynecology;  Laterality: N/A;   LAPAROSCOPIC BILATERAL SALPINGO OOPHERECTOMY Bilateral 07/02/2015   Procedure: LAPAROSCOPIC BILATERAL SALPINGO OOPHORECTOMY;  Surgeon: Artemisa Bile, MD;  Location: [facility];  Service: Gynecology;  Laterality: Bilateral;   LAPAROTOMY     pancreatitis   left thyroid  surgery  12/22/2013   TUBAL LIGATION      Family History  Problem Relation Age of Onset   Diabetes Father    Thyroid  disease Neg Hx     Medications- reviewed and updated Current Outpatient Medications  Medication Sig Dispense Refill   ascorbic acid (VITAMIN C) 500 MG tablet Take 500 mg by mouth 3 (three) times a week.     Calcium Carb-Cholecalciferol (CALCIUM 500/D PO) Take 1 tablet by mouth 3 (three) times a week.     levothyroxine  (SYNTHROID ) 112 MCG tablet Take 1 tablet (112 mcg total) by mouth daily before breakfast. 30 tablet 2   Multiple Vitamin (MULTIVITAMIN) tablet Take 1 tablet by mouth 3 (three) times a week.     No current facility-administered medications for this visit.    Allergies-reviewed and updated No Known Allergies  Social History   Socioeconomic History   Marital status: Married    Spouse name: Not on file   Number of children: Not on file   Years of education: Not on file   Highest education level: Bachelor's degree (e.g., BA, AB, BS)  Occupational History   Not on file  Tobacco Use   Smoking status: Never   Smokeless tobacco: Never  Substance and Sexual Activity   Alcohol use: No   Drug use: No   Sexual activity: Yes    Birth control/protection: Surgical  Other Topics Concern   Not on file  Social History Narrative   Not on file   Social Drivers of Health   Financial Resource Strain: Low Risk  (02/23/2024)   Overall Financial Resource Strain (CARDIA)    Difficulty of Paying Living Expenses: Not very hard  Food Insecurity: No Food Insecurity (02/23/2024)    Hunger Vital Sign    Worried About Running Out of Food in the Last Year: Never true    Ran Out of Food in the Last Year: Never true  Transportation Needs: No Transportation Needs (02/23/2024)   PRAPARE - Administrator, Civil Service (Medical): No    Lack of Transportation (Non-Medical): No  Physical Activity: Insufficiently Active (02/23/2024)   Exercise Vital Sign    Days of Exercise per Week: 2 days    Minutes of Exercise per Session: 30 min  Stress: No Stress Concern Present (02/23/2024)   [employer] of Occupational Health - Occupational Stress Questionnaire    Feeling of Stress : Not at all  Social Connections: Socially Integrated (02/23/2024)   Social Connection and Isolation Panel [NHANES]    Frequency of Communication with Friends and Family: More than three times a week    Frequency of Social Gatherings with Friends and Family: More than three times a week    Attends Religious Services: More than 4 times per year    Active Member of [employer] or Organizations: Yes    Attends Engineer, structural: More than 4 times per year    Marital Status: Married        Objective:  Physical Exam: BP  107/74   Pulse 64   Temp (!) 97.5 F (36.4 C) (Temporal)   Ht 5\' 7"  (1.702 m)   Wt 152 lb (68.9 kg)   LMP 06/16/2015   SpO2 98%   BMI 23.81 kg/m   Body mass index is 23.81 kg/m. Wt Readings from Last 3 Encounters:  04/11/24 152 lb (68.9 kg)  02/23/24 152 lb 9.6 oz (69.2 kg)  06/04/23 147 lb (66.7 kg)   Gen: NAD, resting comfortably HEENT: TMs normal bilaterally. OP clear. No thyromegaly noted.  CV: RRR with no murmurs appreciated Pulm: NWOB, CTAB with no crackles, wheezes, or rhonchi GI: Normal bowel sounds present. Soft, Nontender, Nondistended. MSK: no edema, cyanosis, or clubbing noted Skin: warm, dry Neuro: CN2-12 grossly intact. Strength 5/5 in upper and lower extremities. Reflexes symmetric and intact bilaterally.  Psych: Normal affect and thought  content     Alix Stowers M. Daneil Dunker, MD 04/11/2024 8:25 AM

## 2024-04-11 NOTE — Assessment & Plan Note (Signed)
 On Protonix  40 mg daily as needed per GI.  Check B12.

## 2024-04-11 NOTE — Assessment & Plan Note (Signed)
 Recent ultrasound showed stable appearance compared to 2019.  No further testing indicated at this time.

## 2024-04-11 NOTE — Assessment & Plan Note (Signed)
Continue management per GI.  

## 2024-04-11 NOTE — Assessment & Plan Note (Signed)
On Synthroid 112 mcg daily.  Check TSH. 

## 2024-04-11 NOTE — Progress Notes (Signed)
 Her LDL was mildly elevated.  Stable to her last value.  Do not need to make any medication changes for this however she should continue to work on diet and exercise and we can recheck again in a year.  Her iron is low but improving.  Recommend that she continue her iron supplementation and recheck in 3 to 6 months.  A1c has improved to 5.9.  It was 6.4 last year.  She should continue to work on diet and exercise and we can recheck in a year.  Her B12 is low.  Recommend starting 1000 mcg daily.  We should recheck in 3 months.    The rest of her labs are all at goal.

## 2024-04-11 NOTE — Patient Instructions (Addendum)
 It was very nice to see you today!  We will check blood work today.  Please continue to work on diet and exercise.  Will see back in a year for your next physical.  Please come back to see us  sooner if needed.  Return in about 1 year (around 04/11/2025) for Annual Physical.   Take care, Dr Daneil Dunker  PLEASE NOTE:  If you had any lab tests, please let us  know if you have not heard back within a few days. You may see your results on mychart before we have a chance to review them but we will give you a call once they are reviewed by us .   If we ordered any referrals today, please let us  know if you have not heard from their office within the next week.   If you had any urgent prescriptions sent in today, please check with the pharmacy within an hour of our visit to make sure the prescription was transmitted appropriately.   Please try these tips to maintain a healthy lifestyle:  Eat at least 3 REAL meals and 1-2 snacks per day.  Aim for no more than 5 hours between eating.  If you eat breakfast, please do so within one hour of getting up.   Each meal should contain half fruits/vegetables, one quarter protein, and one quarter carbs (no bigger than a computer mouse)  Cut down on sweet beverages. This includes juice, soda, and sweet tea.   Drink at least 1 glass of water  with each meal and aim for at least 8 glasses per day  Exercise at least 150 minutes every week.     Preventive Care 55-77 Years Old, Female Preventive care refers to lifestyle choices and visits with your health care provider that can promote health and wellness. Preventive care visits are also called wellness exams. What can I expect for my preventive care visit? Counseling Your health care provider may ask you questions about your: Medical history, including: Past medical problems. Family medical history. Pregnancy history. Current health, including: Menstrual cycle. Method of birth control. Emotional  well-being. Home life and relationship well-being. Sexual activity and sexual health. Lifestyle, including: Alcohol, nicotine or tobacco, and drug use. Access to firearms. Diet, exercise, and sleep habits. Work and work Astronomer. Sunscreen use. Safety issues such as seatbelt and bike helmet use. Physical exam Your health care provider will check your: Height and weight. These may be used to calculate your BMI (body mass index). BMI is a measurement that tells if you are at a healthy weight. Waist circumference. This measures the distance around your waistline. This measurement also tells if you are at a healthy weight and may help predict your risk of certain diseases, such as type 2 diabetes and high blood pressure. Heart rate and blood pressure. Body temperature. Skin for abnormal spots. What immunizations do I need?  Vaccines are usually given at various ages, according to a schedule. Your health care provider will recommend vaccines for you based on your age, medical history, and lifestyle or other factors, such as travel or where you work. What tests do I need? Screening Your health care provider may recommend screening tests for certain conditions. This may include: Lipid and cholesterol levels. Diabetes screening. This is done by checking your blood sugar (glucose) after you have not eaten for a while (fasting). Pelvic exam and Pap test. Hepatitis B test. Hepatitis C test. HIV (human immunodeficiency virus) test. STI (sexually transmitted infection) testing, if you are at risk. Lung  cancer screening. Colorectal cancer screening. Mammogram. Talk with your health care provider about when you should start having regular mammograms. This may depend on whether you have a family history of breast cancer. BRCA-related cancer screening. This may be done if you have a family history of breast, ovarian, tubal, or peritoneal cancers. Bone density scan. This is done to screen for  osteoporosis. Talk with your health care provider about your test results, treatment options, and if necessary, the need for more tests. Follow these instructions at home: Eating and drinking  Eat a diet that includes fresh fruits and vegetables, whole grains, lean protein, and low-fat dairy products. Take vitamin and mineral supplements as recommended by your health care provider. Do not drink alcohol if: Your health care provider tells you not to drink. You are pregnant, may be pregnant, or are planning to become pregnant. If you drink alcohol: Limit how much you have to 0-1 drink a day. Know how much alcohol is in your drink. In the [place] one drink equals one 12 oz bottle of beer (355 mL), one 5 oz glass of wine (148 mL), or one 1 oz glass of hard liquor (44 mL). Lifestyle Brush your teeth every morning and night with fluoride toothpaste. Floss one time each day. Exercise for at least 30 minutes 5 or more days each week. Do not use any products that contain nicotine or tobacco. These products include cigarettes, chewing tobacco, and vaping devices, such as e-cigarettes. If you need help quitting, ask your health care provider. Do not use drugs. If you are sexually active, practice safe sex. Use a condom or other form of protection to prevent STIs. If you do not wish to become pregnant, use a form of birth control. If you plan to become pregnant, see your health care provider for a prepregnancy visit. Take aspirin only as told by your health care provider. Make sure that you understand how much to take and what form to take. Work with your health care provider to find out whether it is safe and beneficial for you to take aspirin daily. Find healthy ways to manage stress, such as: Meditation, yoga, or listening to music. Journaling. Talking to a trusted person. Spending time with friends and family. Minimize exposure to UV radiation to reduce your risk of skin cancer. Safety Always wear  your seat belt while driving or riding in a vehicle. Do not drive: If you have been drinking alcohol. Do not ride with someone who has been drinking. When you are tired or distracted. While texting. If you have been using any mind-altering substances or drugs. Wear a helmet and other protective equipment during sports activities. If you have firearms in your house, make sure you follow all gun safety procedures. Seek help if you have been physically or sexually abused. What's next? Visit your health care provider once a year for an annual wellness visit. Ask your health care provider how often you should have your eyes and teeth checked. Stay up to date on all vaccines. This information is not intended to replace advice given to you by your health care provider. Make sure you discuss any questions you have with your health care provider. Document Revised: 06/05/2021 Document Reviewed: 06/05/2021 Elsevier Patient Education  2024 [employer].

## 2024-04-11 NOTE — Assessment & Plan Note (Signed)
 Check A1c.

## 2024-04-11 NOTE — Assessment & Plan Note (Signed)
 Check lipids

## 2024-04-25 ENCOUNTER — Other Ambulatory Visit: Payer: Self-pay | Admitting: Family Medicine

## 2024-04-26 ENCOUNTER — Encounter: Payer: Medicaid Other | Admitting: Family Medicine

## 2024-04-26 MED ORDER — LEVOTHYROXINE SODIUM 112 MCG PO TABS: 112.0000 ug | ORAL_TABLET | Freq: Every day | ORAL | 2 refills | Status: DC

## 2024-05-30 ENCOUNTER — Telehealth: Payer: Self-pay | Admitting: *Deleted

## 2024-05-30 NOTE — Telephone Encounter (Signed)
 Copied from CRM [id number]. Topic: Clinical - Request for Lab/Test Order >> May 30, 2024  8:26 AM Kristen Mendez wrote: Reason for CRM: Patient's eye doctor requested her to have a sed rate blood test, would like this done this afternoon if possible. Needing order. Can contact via phone at [phone] or MyChart   Please advise  [facility]

## 2024-05-31 NOTE — Telephone Encounter (Signed)
 Please schedule an office visit.

## 2024-05-31 NOTE — Telephone Encounter (Signed)
 Pt scheduled for f/u with pcp

## 2024-05-31 NOTE — Telephone Encounter (Signed)
 Can we get more info? It is ok for her to get labs if needed but we need more information and she probably needs an office visit.  Jinny Mounts. Daneil Dunker, MD 05/31/2024 7:47 AM

## 2024-06-01 ENCOUNTER — Telehealth: Payer: Self-pay | Admitting: *Deleted

## 2024-06-01 NOTE — Telephone Encounter (Signed)
 Ok with me. Please place any necessary orders.

## 2024-06-01 NOTE — Telephone Encounter (Signed)
 Copied from CRM [id number]. Topic: General - Other >> May 31, 2024  9:02 AM Adonis Hoot wrote: Reason for CRM: Patient called in to see if lab orders were placed for sed rate were placed,I relayed message form Dr Daneil Dunker. Patient stated that she had cataract surgery on left eye last years,however it is still blurry and she has been having a headache. Her eye doctor wants to refer back to her surgeon however he would like to have her sed rate checked first.Please call patient to schedule if provider is okay with placing orders.   Please advise  Yaeko Fazekas,RMA

## 2024-06-02 ENCOUNTER — Other Ambulatory Visit: Payer: Self-pay | Admitting: *Deleted

## 2024-06-02 DIAGNOSIS — M542 Cervicalgia: Secondary | ICD-10-CM

## 2024-06-02 NOTE — Telephone Encounter (Signed)
 Future lab order  Please schedule a lab appt for patient

## 2024-06-02 NOTE — Telephone Encounter (Signed)
 Unable to reach , lvm requesting call back to schedule lab appt

## 2024-06-03 ENCOUNTER — Other Ambulatory Visit: Payer: Self-pay

## 2024-06-03 ENCOUNTER — Other Ambulatory Visit

## 2024-06-03 DIAGNOSIS — M542 Cervicalgia: Secondary | ICD-10-CM | POA: Diagnosis not present

## 2024-06-04 LAB — SEDIMENTATION RATE: Sed Rate: 2 mm/h (ref 0–30)

## 2024-06-06 ENCOUNTER — Ambulatory Visit: Payer: Self-pay | Admitting: Family Medicine

## 2024-06-06 NOTE — Progress Notes (Signed)
 Her sedimentation rate is normal.

## 2024-06-07 ENCOUNTER — Ambulatory Visit: Admitting: Family Medicine

## 2024-08-23 ENCOUNTER — Other Ambulatory Visit: Payer: Self-pay | Admitting: Family Medicine

## 2024-08-23 DIAGNOSIS — Z1231 Encounter for screening mammogram for malignant neoplasm of breast: Secondary | ICD-10-CM

## 2024-09-26 ENCOUNTER — Encounter: Payer: Self-pay | Admitting: Family Medicine

## 2024-09-26 ENCOUNTER — Ambulatory Visit: Admitting: Family Medicine

## 2024-09-26 VITALS — BP 98/60 | HR 66 | Temp 98.1°F | Ht 67.0 in | Wt 146.6 lb

## 2024-09-26 DIAGNOSIS — R7303 Prediabetes: Secondary | ICD-10-CM | POA: Diagnosis not present

## 2024-09-26 DIAGNOSIS — K219 Gastro-esophageal reflux disease without esophagitis: Secondary | ICD-10-CM | POA: Diagnosis not present

## 2024-09-26 LAB — CBC
HCT: 41.1 % (ref 36.0–46.0)
Hemoglobin: 14 g/dL (ref 12.0–15.0)
MCHC: 34 g/dL (ref 30.0–36.0)
MCV: 89.7 fl (ref 78.0–100.0)
Platelets: 188 K/uL (ref 150.0–400.0)
RBC: 4.59 Mil/uL (ref 3.87–5.11)
RDW: 13.6 % (ref 11.5–15.5)
WBC: 4.5 K/uL (ref 4.0–10.5)

## 2024-09-26 LAB — COMPREHENSIVE METABOLIC PANEL WITH GFR
ALT: 24 U/L (ref 0–35)
AST: 20 U/L (ref 0–37)
Albumin: 4.4 g/dL (ref 3.5–5.2)
Alkaline Phosphatase: 68 U/L (ref 39–117)
BUN: 19 mg/dL (ref 6–23)
CO2: 27 meq/L (ref 19–32)
Calcium: 9.3 mg/dL (ref 8.4–10.5)
Chloride: 105 meq/L (ref 96–112)
Creatinine, Ser: 0.56 mg/dL (ref 0.40–1.20)
GFR: 100.82 mL/min (ref >60.00)
Glucose, Bld: 113 mg/dL — ABNORMAL HIGH (ref 70–99)
Potassium: 4.1 meq/L (ref 3.5–5.1)
Sodium: 143 meq/L (ref 135–145)
Total Bilirubin: 0.5 mg/dL (ref 0.2–1.2)
Total Protein: 7.1 g/dL (ref 6.0–8.3)

## 2024-09-26 LAB — HEMOGLOBIN A1C: Hgb A1c MFr Bld: 5.9 % (ref 4.6–6.5)

## 2024-09-26 LAB — LIPASE: Lipase: 53 U/L (ref 11.0–59.0)

## 2024-09-26 MED ORDER — PANTOPRAZOLE SODIUM 40 MG PO TBEC: 40.0000 mg | DELAYED_RELEASE_TABLET | Freq: Two times a day (BID) | ORAL | 3 refills | Status: AC

## 2024-09-26 NOTE — Progress Notes (Signed)
   Kristen Mendez is a 58 y.o. female who presents today for an office visit.  Assessment/Plan:  New/Acute Problems: Epigastric abdominal pain No red flags.  Overall reassuring exam.  Concern for flareup of her GERD/gastritis.  She is status post cholecystectomy.  She did have small bowel ulceration noted on capsule endoscopy a year ago.  Recommended she restart her Protonix  though we will increase the dose to 40 mg twice daily.  Check labs today.  She will let us  know if not proving in the next 1 to 2 weeks and would consider referral to GI versus imaging at that time.  We discussed reasons to return to care.  Chronic Problems Addressed Today: GERD (gastroesophageal reflux disease) Likely the source of her above epigastric abdominal pain.  Was found to have small bowel ulceration last year.  We are increasing her Protonix  to 40 mg twice daily and checking labs today.  She will need to follow back up with GI if not improving.  Prediabetes She is working on lifestyle interventions.  Check A1c today with labs.     Subjective:  HPI:  See assessment / plan for status of chronic conditions.    Discussed the use of AI scribe software for clinical note transcription with the patient, who gave verbal consent to proceed.  History of Present Illness Kristen Mendez is a 58 year old female who presents with upper abdominal discomfort and digestive issues.  She has been experiencing a 'weird' sensation in her stomach and upper back for the past two weeks. The discomfort is not described as pain but rather as a digestive issue, sometimes radiating to her chest and back. She has been taking reflux medication for the past two weeks without significant relief.  No nausea, vomiting, constipation, or diarrhea. Bowel movements are regular, occurring daily.  Symptoms seem to come and go throughout the day.  She is not sure if eating makes the pain better or worse.  She has a history of pancreatitis during her  youth, which was severe and difficult to diagnose at the time. She recalls having an endoscopy last year that revealed small ulcers throughout her small intestine, for which she was prescribed iron supplements for a month.  Her husband has diabetes, and she has been trying to exercise more since her A1c levels were slightly elevated. She mentions stress as a factor in managing her health.         Objective:  Physical Exam: BP 98/60   Pulse 66   Temp 98.1 F (36.7 C) (Temporal)   Ht 5' 7 (1.702 m)   Wt 146 lb 9.6 oz (66.5 kg)   LMP 06/16/2015   SpO2 98%   BMI 22.96 kg/m   Gen: No acute distress, resting comfortably CV: Regular rate and rhythm with no murmurs appreciated Pulm: Normal work of breathing, clear to auscultation bilaterally with no crackles, wheezes, or rhonchi Abdomen: Bowel sounds present, soft, nontender, nondistended.  No rebound or guarding. Neuro: Grossly normal, moves all extremities Psych: Normal affect and thought content      Tsuyako Jolley M. Kennyth, MD 09/26/2024 7:43 AM

## 2024-09-26 NOTE — Patient Instructions (Signed)
 It was very nice to see you today!  VISIT SUMMARY: You visited us  today due to upper abdominal discomfort and digestive issues. We discussed your ongoing symptoms and made adjustments to your treatment plan.  YOUR PLAN: GASTROESOPHAGEAL REFLUX DISEASE AND SMALL INTESTINE ULCERS: You have been experiencing upper abdominal and back discomfort, which is consistent with GERD and small intestine ulcers. Your symptoms have not completely resolved with your current medication. -Increase Protonix  to 40 mg twice daily. -We will order blood work to assess your pancreas, liver, and related functions. -We will re-evaluate your symptoms in two weeks. -If your symptoms persist, we may consider further imaging or a repeat endoscopy.  PREDIABETES: You are managing your blood sugar with diet and exercise. -We will recheck your A1c with the current blood work.  Return if symptoms worsen or fail to improve.   Take care, Dr Kennyth  PLEASE NOTE:  If you had any lab tests, please let us  know if you have not heard back within a few days. You may see your results on mychart before we have a chance to review them but we will give you a call once they are reviewed by us .   If we ordered any referrals today, please let us  know if you have not heard from their office within the next week.   If you had any urgent prescriptions sent in today, please check with the pharmacy within an hour of our visit to make sure the prescription was transmitted appropriately.   Please try these tips to maintain a healthy lifestyle:  Eat at least 3 REAL meals and 1-2 snacks per day.  Aim for no more than 5 hours between eating.  If you eat breakfast, please do so within one hour of getting up.   Each meal should contain half fruits/vegetables, one quarter protein, and one quarter carbs (no bigger than a computer mouse)  Cut down on sweet beverages. This includes juice, soda, and sweet tea.   Drink at least 1 glass of water   with each meal and aim for at least 8 glasses per day  Exercise at least 150 minutes every week.

## 2024-09-26 NOTE — Assessment & Plan Note (Signed)
 She is working on lifestyle interventions.  Check A1c today with labs.

## 2024-09-26 NOTE — Assessment & Plan Note (Signed)
 Likely the source of her above epigastric abdominal pain.  Was found to have small bowel ulceration last year.  We are increasing her Protonix  to 40 mg twice daily and checking labs today.  She will need to follow back up with GI if not improving.

## 2024-09-27 ENCOUNTER — Ambulatory Visit: Payer: Self-pay | Admitting: Family Medicine

## 2024-09-27 NOTE — Progress Notes (Signed)
 Her labs are all stable.  A1c is stable at 5.9.  Her pancreas, gallbladder, and liver numbers are all normal.  As we discussed at her office visit I would like for her to take the acid blocker for a couple of weeks and let us  know if her symptoms are not improving.

## 2024-10-06 ENCOUNTER — Ambulatory Visit (INDEPENDENT_AMBULATORY_CARE_PROVIDER_SITE_OTHER)

## 2024-10-06 DIAGNOSIS — Z1231 Encounter for screening mammogram for malignant neoplasm of breast: Secondary | ICD-10-CM | POA: Diagnosis not present

## 2024-10-21 ENCOUNTER — Other Ambulatory Visit: Payer: Self-pay | Admitting: Family Medicine

## 2024-12-21 ENCOUNTER — Encounter: Payer: Self-pay | Admitting: Family Medicine

## 2025-01-10 ENCOUNTER — Other Ambulatory Visit: Payer: Self-pay | Admitting: Family Medicine

## 2025-01-10 MED ORDER — LEVOTHYROXINE SODIUM 112 MCG PO TABS: 112.0000 ug | ORAL_TABLET | Freq: Every day | ORAL | 2 refills | Status: AC

## 2025-01-10 NOTE — Telephone Encounter (Signed)
 Copied from CRM #[id number]. Topic: Clinical - Medication Refill >> Jan 10, 2025  9:03 AM Revonda D wrote: Medication: levothyroxine  (SYNTHROID ) 112 MCG tablet  Has the patient contacted their pharmacy? Yes (Agent: If no, request that the patient contact the pharmacy for the refill. If patient does not wish to contact the pharmacy document the reason why and proceed with request.) (Agent: If yes, when and what did the pharmacy advise?)  This is the patient's preferred pharmacy:  [id number] [place], [place] - [address] AT [facility] [address] [place] [address] Phone: [phone] Fax: [phone]  Is this the correct pharmacy for this prescription? Yes If no, delete pharmacy and type the correct one.   Has the prescription been filled recently? No  Is the patient out of the medication? No  Has the patient been seen for an appointment in the last year OR does the patient have an upcoming appointment? Yes  Can we respond through MyChart? Yes  Agent: Please be advised that Rx refills may take up to 3 business days. We ask that you follow-up with your pharmacy.

## 2025-04-17 ENCOUNTER — Encounter: Admitting: Family Medicine
